# Patient Record
Sex: Female | Born: 1935 | Race: White | Hispanic: No | Smoking: Former smoker
Health system: Southern US, Community
[De-identification: ages and names within clinical notes are randomized; demographics above are authoritative.]

## PROBLEM LIST (undated history)

## (undated) DIAGNOSIS — I639 Cerebral infarction, unspecified: Secondary | ICD-10-CM

## (undated) DIAGNOSIS — I4891 Unspecified atrial fibrillation: Secondary | ICD-10-CM

## (undated) DIAGNOSIS — I059 Rheumatic mitral valve disease, unspecified: Secondary | ICD-10-CM

## (undated) DIAGNOSIS — K222 Esophageal obstruction: Secondary | ICD-10-CM

## (undated) DIAGNOSIS — J159 Unspecified bacterial pneumonia: Secondary | ICD-10-CM

## (undated) DIAGNOSIS — G459 Transient cerebral ischemic attack, unspecified: Secondary | ICD-10-CM

## (undated) DIAGNOSIS — K2900 Acute gastritis without bleeding: Secondary | ICD-10-CM

## (undated) DIAGNOSIS — I019 Acute rheumatic heart disease, unspecified: Secondary | ICD-10-CM

## (undated) DIAGNOSIS — K219 Gastro-esophageal reflux disease without esophagitis: Secondary | ICD-10-CM

## (undated) DIAGNOSIS — Z952 Presence of prosthetic heart valve: Secondary | ICD-10-CM

## (undated) DIAGNOSIS — K317 Polyp of stomach and duodenum: Secondary | ICD-10-CM

## (undated) DIAGNOSIS — G500 Trigeminal neuralgia: Secondary | ICD-10-CM

## (undated) DIAGNOSIS — K659 Peritonitis, unspecified: Secondary | ICD-10-CM

## (undated) DIAGNOSIS — K5790 Diverticulosis of intestine, part unspecified, without perforation or abscess without bleeding: Secondary | ICD-10-CM

## (undated) DIAGNOSIS — I5032 Chronic diastolic (congestive) heart failure: Secondary | ICD-10-CM

## (undated) HISTORY — PX: ESOPHAGEAL DILATION: SHX303

## (undated) HISTORY — DX: Rheumatic mitral valve disease, unspecified: I05.9

## (undated) HISTORY — DX: Acute rheumatic heart disease, unspecified: I01.9

## (undated) HISTORY — DX: Unspecified bacterial pneumonia: J15.9

## (undated) HISTORY — DX: Unspecified atrial fibrillation: I48.91

## (undated) HISTORY — DX: Peritonitis, unspecified: K65.9

## (undated) HISTORY — DX: Chronic diastolic (congestive) heart failure: I50.32

## (undated) HISTORY — DX: Gastro-esophageal reflux disease without esophagitis: K21.9

## (undated) HISTORY — DX: Presence of prosthetic heart valve: Z95.2

## (undated) HISTORY — DX: Trigeminal neuralgia: G50.0

## (undated) HISTORY — DX: Cerebral infarction, unspecified: I63.9

## (undated) HISTORY — DX: Acute gastritis without bleeding: K29.00

## (undated) HISTORY — DX: Transient cerebral ischemic attack, unspecified: G45.9

## (undated) HISTORY — DX: Polyp of stomach and duodenum: K31.7

## (undated) HISTORY — DX: Esophageal obstruction: K22.2

## (undated) HISTORY — DX: Diverticulosis of intestine, part unspecified, without perforation or abscess without bleeding: K57.90

---
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
## 1979-11-22 HISTORY — PX: MITRAL VALVE REPLACEMENT: SHX147

## 1990-08-21 ENCOUNTER — Encounter: Payer: Self-pay | Admitting: Internal Medicine

## 1998-09-02 ENCOUNTER — Emergency Department (HOSPITAL_COMMUNITY): Admission: EM | Admit: 1998-09-02 | Discharge: 1998-09-02 | Payer: Self-pay | Admitting: Emergency Medicine

## 1998-12-26 ENCOUNTER — Encounter: Payer: Self-pay | Admitting: Neurosurgery

## 1998-12-26 ENCOUNTER — Inpatient Hospital Stay (HOSPITAL_COMMUNITY): Admission: RE | Admit: 1998-12-26 | Discharge: 1999-01-09 | Payer: Self-pay | Admitting: Neurosurgery

## 1998-12-30 ENCOUNTER — Encounter: Payer: Self-pay | Admitting: Neurosurgery

## 1998-12-31 ENCOUNTER — Encounter: Payer: Self-pay | Admitting: Neurosurgery

## 1999-01-06 ENCOUNTER — Encounter: Payer: Self-pay | Admitting: Internal Medicine

## 1999-06-03 ENCOUNTER — Ambulatory Visit (HOSPITAL_COMMUNITY): Admission: RE | Admit: 1999-06-03 | Discharge: 1999-06-03 | Payer: Self-pay | Admitting: Neurosurgery

## 1999-06-03 ENCOUNTER — Encounter: Payer: Self-pay | Admitting: Neurosurgery

## 2000-06-21 ENCOUNTER — Encounter: Payer: Self-pay | Admitting: Neurosurgery

## 2000-06-21 ENCOUNTER — Ambulatory Visit (HOSPITAL_COMMUNITY): Admission: RE | Admit: 2000-06-21 | Discharge: 2000-06-21 | Payer: Self-pay | Admitting: Neurosurgery

## 2001-06-09 ENCOUNTER — Ambulatory Visit (HOSPITAL_COMMUNITY): Admission: RE | Admit: 2001-06-09 | Discharge: 2001-06-09 | Payer: Self-pay | Admitting: Neurosurgery

## 2001-06-09 ENCOUNTER — Encounter: Payer: Self-pay | Admitting: Neurosurgery

## 2001-07-02 ENCOUNTER — Encounter: Payer: Self-pay | Admitting: Internal Medicine

## 2001-08-10 ENCOUNTER — Encounter: Payer: Self-pay | Admitting: Internal Medicine

## 2001-08-15 ENCOUNTER — Encounter: Payer: Self-pay | Admitting: Internal Medicine

## 2001-08-15 ENCOUNTER — Encounter (INDEPENDENT_AMBULATORY_CARE_PROVIDER_SITE_OTHER): Payer: Self-pay | Admitting: *Deleted

## 2001-08-15 ENCOUNTER — Ambulatory Visit (HOSPITAL_COMMUNITY): Admission: RE | Admit: 2001-08-15 | Discharge: 2001-08-15 | Payer: Self-pay | Admitting: Internal Medicine

## 2001-08-31 ENCOUNTER — Ambulatory Visit (HOSPITAL_COMMUNITY): Admission: RE | Admit: 2001-08-31 | Discharge: 2001-08-31 | Payer: Self-pay | Admitting: Neurology

## 2001-08-31 ENCOUNTER — Encounter: Payer: Self-pay | Admitting: Neurology

## 2001-10-12 ENCOUNTER — Encounter: Admission: RE | Admit: 2001-10-12 | Discharge: 2001-11-22 | Payer: Self-pay | Admitting: Neurology

## 2001-11-21 HISTORY — PX: CHOLECYSTECTOMY: SHX55

## 2002-07-09 ENCOUNTER — Encounter: Payer: Self-pay | Admitting: Internal Medicine

## 2002-07-11 ENCOUNTER — Encounter (INDEPENDENT_AMBULATORY_CARE_PROVIDER_SITE_OTHER): Payer: Self-pay | Admitting: *Deleted

## 2002-07-11 ENCOUNTER — Ambulatory Visit (HOSPITAL_COMMUNITY): Admission: RE | Admit: 2002-07-11 | Discharge: 2002-07-11 | Payer: Self-pay | Admitting: Internal Medicine

## 2002-07-11 ENCOUNTER — Encounter: Payer: Self-pay | Admitting: Internal Medicine

## 2002-07-16 ENCOUNTER — Encounter: Payer: Self-pay | Admitting: Internal Medicine

## 2002-08-21 ENCOUNTER — Encounter: Payer: Self-pay | Admitting: Internal Medicine

## 2002-09-19 ENCOUNTER — Inpatient Hospital Stay (HOSPITAL_COMMUNITY): Admission: RE | Admit: 2002-09-19 | Discharge: 2002-09-29 | Payer: Self-pay | Admitting: General Surgery

## 2002-09-19 ENCOUNTER — Encounter: Payer: Self-pay | Admitting: General Surgery

## 2002-09-19 ENCOUNTER — Encounter (INDEPENDENT_AMBULATORY_CARE_PROVIDER_SITE_OTHER): Payer: Self-pay | Admitting: *Deleted

## 2002-09-23 ENCOUNTER — Encounter (INDEPENDENT_AMBULATORY_CARE_PROVIDER_SITE_OTHER): Payer: Self-pay | Admitting: *Deleted

## 2002-09-23 ENCOUNTER — Encounter: Payer: Self-pay | Admitting: General Surgery

## 2002-12-02 ENCOUNTER — Encounter (INDEPENDENT_AMBULATORY_CARE_PROVIDER_SITE_OTHER): Payer: Self-pay | Admitting: *Deleted

## 2003-11-19 ENCOUNTER — Emergency Department (HOSPITAL_COMMUNITY): Admission: EM | Admit: 2003-11-19 | Discharge: 2003-11-20 | Payer: Self-pay | Admitting: Emergency Medicine

## 2003-12-01 ENCOUNTER — Encounter: Payer: Self-pay | Admitting: Internal Medicine

## 2003-12-03 ENCOUNTER — Ambulatory Visit (HOSPITAL_COMMUNITY): Admission: RE | Admit: 2003-12-03 | Discharge: 2003-12-03 | Payer: Self-pay | Admitting: Internal Medicine

## 2003-12-31 ENCOUNTER — Encounter: Payer: Self-pay | Admitting: Internal Medicine

## 2004-09-09 ENCOUNTER — Inpatient Hospital Stay (HOSPITAL_COMMUNITY): Admission: EM | Admit: 2004-09-09 | Discharge: 2004-09-10 | Payer: Self-pay | Admitting: Emergency Medicine

## 2004-09-10 ENCOUNTER — Encounter: Payer: Self-pay | Admitting: Cardiology

## 2004-09-29 ENCOUNTER — Ambulatory Visit: Payer: Self-pay | Admitting: *Deleted

## 2004-10-18 ENCOUNTER — Ambulatory Visit (HOSPITAL_COMMUNITY): Admission: RE | Admit: 2004-10-18 | Discharge: 2004-10-18 | Payer: Self-pay | Admitting: Cardiology

## 2004-10-18 ENCOUNTER — Ambulatory Visit: Payer: Self-pay | Admitting: Cardiology

## 2004-10-18 ENCOUNTER — Ambulatory Visit: Payer: Self-pay | Admitting: Internal Medicine

## 2004-10-19 ENCOUNTER — Ambulatory Visit: Payer: Self-pay | Admitting: *Deleted

## 2004-11-09 ENCOUNTER — Ambulatory Visit: Payer: Self-pay | Admitting: Cardiology

## 2004-12-08 ENCOUNTER — Ambulatory Visit: Payer: Self-pay | Admitting: Cardiology

## 2004-12-09 ENCOUNTER — Ambulatory Visit: Payer: Self-pay | Admitting: *Deleted

## 2004-12-14 ENCOUNTER — Ambulatory Visit: Payer: Self-pay | Admitting: *Deleted

## 2005-01-06 ENCOUNTER — Ambulatory Visit: Payer: Self-pay | Admitting: Cardiovascular Disease

## 2005-01-13 ENCOUNTER — Ambulatory Visit: Payer: Self-pay | Admitting: Internal Medicine

## 2005-02-03 ENCOUNTER — Ambulatory Visit: Payer: Self-pay | Admitting: Cardiology

## 2005-02-04 ENCOUNTER — Ambulatory Visit: Payer: Self-pay | Admitting: Internal Medicine

## 2005-02-04 ENCOUNTER — Ambulatory Visit (HOSPITAL_COMMUNITY): Admission: RE | Admit: 2005-02-04 | Discharge: 2005-02-04 | Payer: Self-pay | Admitting: Internal Medicine

## 2005-02-04 ENCOUNTER — Encounter (INDEPENDENT_AMBULATORY_CARE_PROVIDER_SITE_OTHER): Payer: Self-pay | Admitting: *Deleted

## 2005-02-10 ENCOUNTER — Ambulatory Visit: Payer: Self-pay | Admitting: Cardiology

## 2005-02-23 ENCOUNTER — Ambulatory Visit: Payer: Self-pay | Admitting: *Deleted

## 2005-03-10 ENCOUNTER — Ambulatory Visit: Payer: Self-pay | Admitting: Internal Medicine

## 2005-03-24 ENCOUNTER — Ambulatory Visit: Payer: Self-pay | Admitting: Internal Medicine

## 2005-03-31 ENCOUNTER — Ambulatory Visit: Payer: Self-pay | Admitting: *Deleted

## 2005-04-06 ENCOUNTER — Ambulatory Visit: Payer: Self-pay

## 2005-04-14 ENCOUNTER — Ambulatory Visit: Payer: Self-pay | Admitting: *Deleted

## 2005-04-20 ENCOUNTER — Ambulatory Visit: Payer: Self-pay | Admitting: Cardiology

## 2005-05-20 ENCOUNTER — Ambulatory Visit: Payer: Self-pay | Admitting: Cardiology

## 2005-05-23 ENCOUNTER — Ambulatory Visit: Payer: Self-pay | Admitting: *Deleted

## 2005-06-01 ENCOUNTER — Ambulatory Visit: Payer: Self-pay | Admitting: Cardiology

## 2005-06-01 ENCOUNTER — Ambulatory Visit: Payer: Self-pay | Admitting: *Deleted

## 2005-06-13 ENCOUNTER — Ambulatory Visit: Payer: Self-pay | Admitting: Cardiology

## 2005-06-23 ENCOUNTER — Ambulatory Visit: Payer: Self-pay

## 2005-07-07 ENCOUNTER — Ambulatory Visit: Payer: Self-pay | Admitting: Cardiology

## 2005-07-12 ENCOUNTER — Ambulatory Visit: Payer: Self-pay | Admitting: *Deleted

## 2005-07-28 ENCOUNTER — Ambulatory Visit: Payer: Self-pay | Admitting: Cardiology

## 2005-08-11 ENCOUNTER — Ambulatory Visit: Payer: Self-pay | Admitting: Cardiology

## 2005-08-23 ENCOUNTER — Ambulatory Visit: Payer: Self-pay | Admitting: *Deleted

## 2005-08-25 ENCOUNTER — Ambulatory Visit: Payer: Self-pay | Admitting: Cardiology

## 2005-08-25 ENCOUNTER — Ambulatory Visit: Payer: Self-pay | Admitting: *Deleted

## 2005-08-30 ENCOUNTER — Ambulatory Visit: Payer: Self-pay | Admitting: *Deleted

## 2005-09-08 ENCOUNTER — Ambulatory Visit: Payer: Self-pay | Admitting: Cardiology

## 2005-09-22 ENCOUNTER — Ambulatory Visit: Payer: Self-pay | Admitting: Cardiology

## 2005-10-17 ENCOUNTER — Ambulatory Visit: Payer: Self-pay | Admitting: Cardiology

## 2005-10-24 ENCOUNTER — Ambulatory Visit: Payer: Self-pay | Admitting: *Deleted

## 2005-10-28 ENCOUNTER — Ambulatory Visit: Payer: Self-pay | Admitting: Cardiology

## 2005-11-18 ENCOUNTER — Ambulatory Visit: Payer: Self-pay | Admitting: Cardiology

## 2005-12-09 ENCOUNTER — Ambulatory Visit: Payer: Self-pay | Admitting: Cardiology

## 2005-12-28 ENCOUNTER — Ambulatory Visit: Payer: Self-pay | Admitting: Cardiology

## 2005-12-28 ENCOUNTER — Ambulatory Visit: Payer: Self-pay | Admitting: *Deleted

## 2006-01-06 ENCOUNTER — Ambulatory Visit: Payer: Self-pay | Admitting: Cardiovascular Disease

## 2006-01-13 ENCOUNTER — Ambulatory Visit: Payer: Self-pay | Admitting: Cardiology

## 2006-02-03 ENCOUNTER — Ambulatory Visit: Payer: Self-pay | Admitting: Cardiovascular Disease

## 2006-02-16 ENCOUNTER — Ambulatory Visit: Payer: Self-pay | Admitting: Cardiology

## 2006-03-03 ENCOUNTER — Ambulatory Visit: Payer: Self-pay | Admitting: Cardiology

## 2006-03-16 ENCOUNTER — Ambulatory Visit: Payer: Self-pay | Admitting: Cardiology

## 2006-03-30 ENCOUNTER — Ambulatory Visit: Payer: Self-pay | Admitting: Cardiology

## 2006-04-13 ENCOUNTER — Ambulatory Visit: Payer: Self-pay | Admitting: Internal Medicine

## 2006-05-04 ENCOUNTER — Ambulatory Visit: Payer: Self-pay | Admitting: Cardiology

## 2006-05-04 ENCOUNTER — Ambulatory Visit: Payer: Self-pay | Admitting: *Deleted

## 2006-05-25 ENCOUNTER — Ambulatory Visit: Payer: Self-pay | Admitting: Cardiology

## 2006-06-14 ENCOUNTER — Ambulatory Visit: Payer: Self-pay | Admitting: Cardiology

## 2006-06-28 ENCOUNTER — Ambulatory Visit: Payer: Self-pay | Admitting: Cardiology

## 2006-07-19 ENCOUNTER — Ambulatory Visit: Payer: Self-pay | Admitting: Cardiology

## 2006-08-16 ENCOUNTER — Ambulatory Visit: Payer: Self-pay | Admitting: Cardiology

## 2006-08-23 ENCOUNTER — Ambulatory Visit: Payer: Self-pay | Admitting: *Deleted

## 2006-09-06 ENCOUNTER — Ambulatory Visit: Payer: Self-pay | Admitting: *Deleted

## 2006-09-13 ENCOUNTER — Ambulatory Visit: Payer: Self-pay | Admitting: *Deleted

## 2006-09-27 ENCOUNTER — Ambulatory Visit: Payer: Self-pay | Admitting: Cardiology

## 2006-10-18 ENCOUNTER — Ambulatory Visit: Payer: Self-pay | Admitting: Cardiovascular Disease

## 2006-10-27 ENCOUNTER — Ambulatory Visit: Payer: Self-pay | Admitting: Cardiology

## 2006-11-16 ENCOUNTER — Ambulatory Visit: Payer: Self-pay | Admitting: Internal Medicine

## 2006-12-08 ENCOUNTER — Ambulatory Visit: Payer: Self-pay | Admitting: *Deleted

## 2006-12-08 ENCOUNTER — Ambulatory Visit: Payer: Self-pay | Admitting: Internal Medicine

## 2006-12-08 LAB — CONVERTED CEMR LAB
CO2: 31 meq/L (ref 19–32)
Calcium: 9.5 mg/dL (ref 8.4–10.5)
Chloride: 103 meq/L (ref 96–112)
Creatinine, Ser: 0.9 mg/dL (ref 0.4–1.2)
GFR calc Af Amer: 77 mL/min
Glucose, Bld: 88 mg/dL (ref 70–99)

## 2006-12-22 ENCOUNTER — Ambulatory Visit: Payer: Self-pay | Admitting: Internal Medicine

## 2007-01-12 ENCOUNTER — Ambulatory Visit: Payer: Self-pay | Admitting: Internal Medicine

## 2007-02-02 ENCOUNTER — Ambulatory Visit: Payer: Self-pay | Admitting: Cardiovascular Disease

## 2007-02-13 ENCOUNTER — Ambulatory Visit: Payer: Self-pay | Admitting: Cardiology

## 2007-02-20 ENCOUNTER — Encounter: Payer: Self-pay | Admitting: Internal Medicine

## 2007-02-21 ENCOUNTER — Ambulatory Visit: Payer: Self-pay | Admitting: Cardiology

## 2007-02-26 ENCOUNTER — Ambulatory Visit: Payer: Self-pay | Admitting: Cardiology

## 2007-03-06 ENCOUNTER — Ambulatory Visit: Payer: Self-pay | Admitting: *Deleted

## 2007-03-19 ENCOUNTER — Ambulatory Visit: Payer: Self-pay | Admitting: *Deleted

## 2007-03-19 ENCOUNTER — Ambulatory Visit: Payer: Self-pay

## 2007-03-19 ENCOUNTER — Ambulatory Visit: Payer: Self-pay | Admitting: Cardiology

## 2007-03-19 ENCOUNTER — Encounter: Payer: Self-pay | Admitting: Cardiology

## 2007-03-19 LAB — CONVERTED CEMR LAB
CO2: 33 meq/L — ABNORMAL HIGH (ref 19–32)
Chloride: 104 meq/L (ref 96–112)
Creatinine, Ser: 0.9 mg/dL (ref 0.4–1.2)
Glucose, Bld: 140 mg/dL — ABNORMAL HIGH (ref 70–99)
Sodium: 141 meq/L (ref 135–145)

## 2007-04-03 ENCOUNTER — Ambulatory Visit: Payer: Self-pay | Admitting: Cardiology

## 2007-04-05 ENCOUNTER — Encounter: Admission: RE | Admit: 2007-04-05 | Discharge: 2007-04-05 | Payer: Self-pay | Admitting: Family Medicine

## 2007-04-10 ENCOUNTER — Ambulatory Visit: Payer: Self-pay | Admitting: Cardiology

## 2007-04-13 ENCOUNTER — Ambulatory Visit: Payer: Self-pay | Admitting: Cardiovascular Disease

## 2007-04-20 ENCOUNTER — Ambulatory Visit: Payer: Self-pay | Admitting: Cardiology

## 2007-04-20 LAB — CONVERTED CEMR LAB: Prothrombin Time: 29.1 s — ABNORMAL HIGH (ref 10.0–14.0)

## 2007-04-24 ENCOUNTER — Encounter: Admission: RE | Admit: 2007-04-24 | Discharge: 2007-04-24 | Payer: Self-pay | Admitting: Family Medicine

## 2007-04-27 ENCOUNTER — Ambulatory Visit: Payer: Self-pay | Admitting: Cardiology

## 2007-05-07 ENCOUNTER — Ambulatory Visit: Payer: Self-pay | Admitting: Cardiology

## 2007-05-21 ENCOUNTER — Ambulatory Visit: Payer: Self-pay | Admitting: Cardiology

## 2007-05-30 IMAGING — CR DG CHEST 2V
2 series · 2 of 2 positions shown · non-contrast
Comparison: 09/09/04

CLINICAL DATA: Wheezing/cough.

 CHEST - 2 VIEW:

[w chest pa]
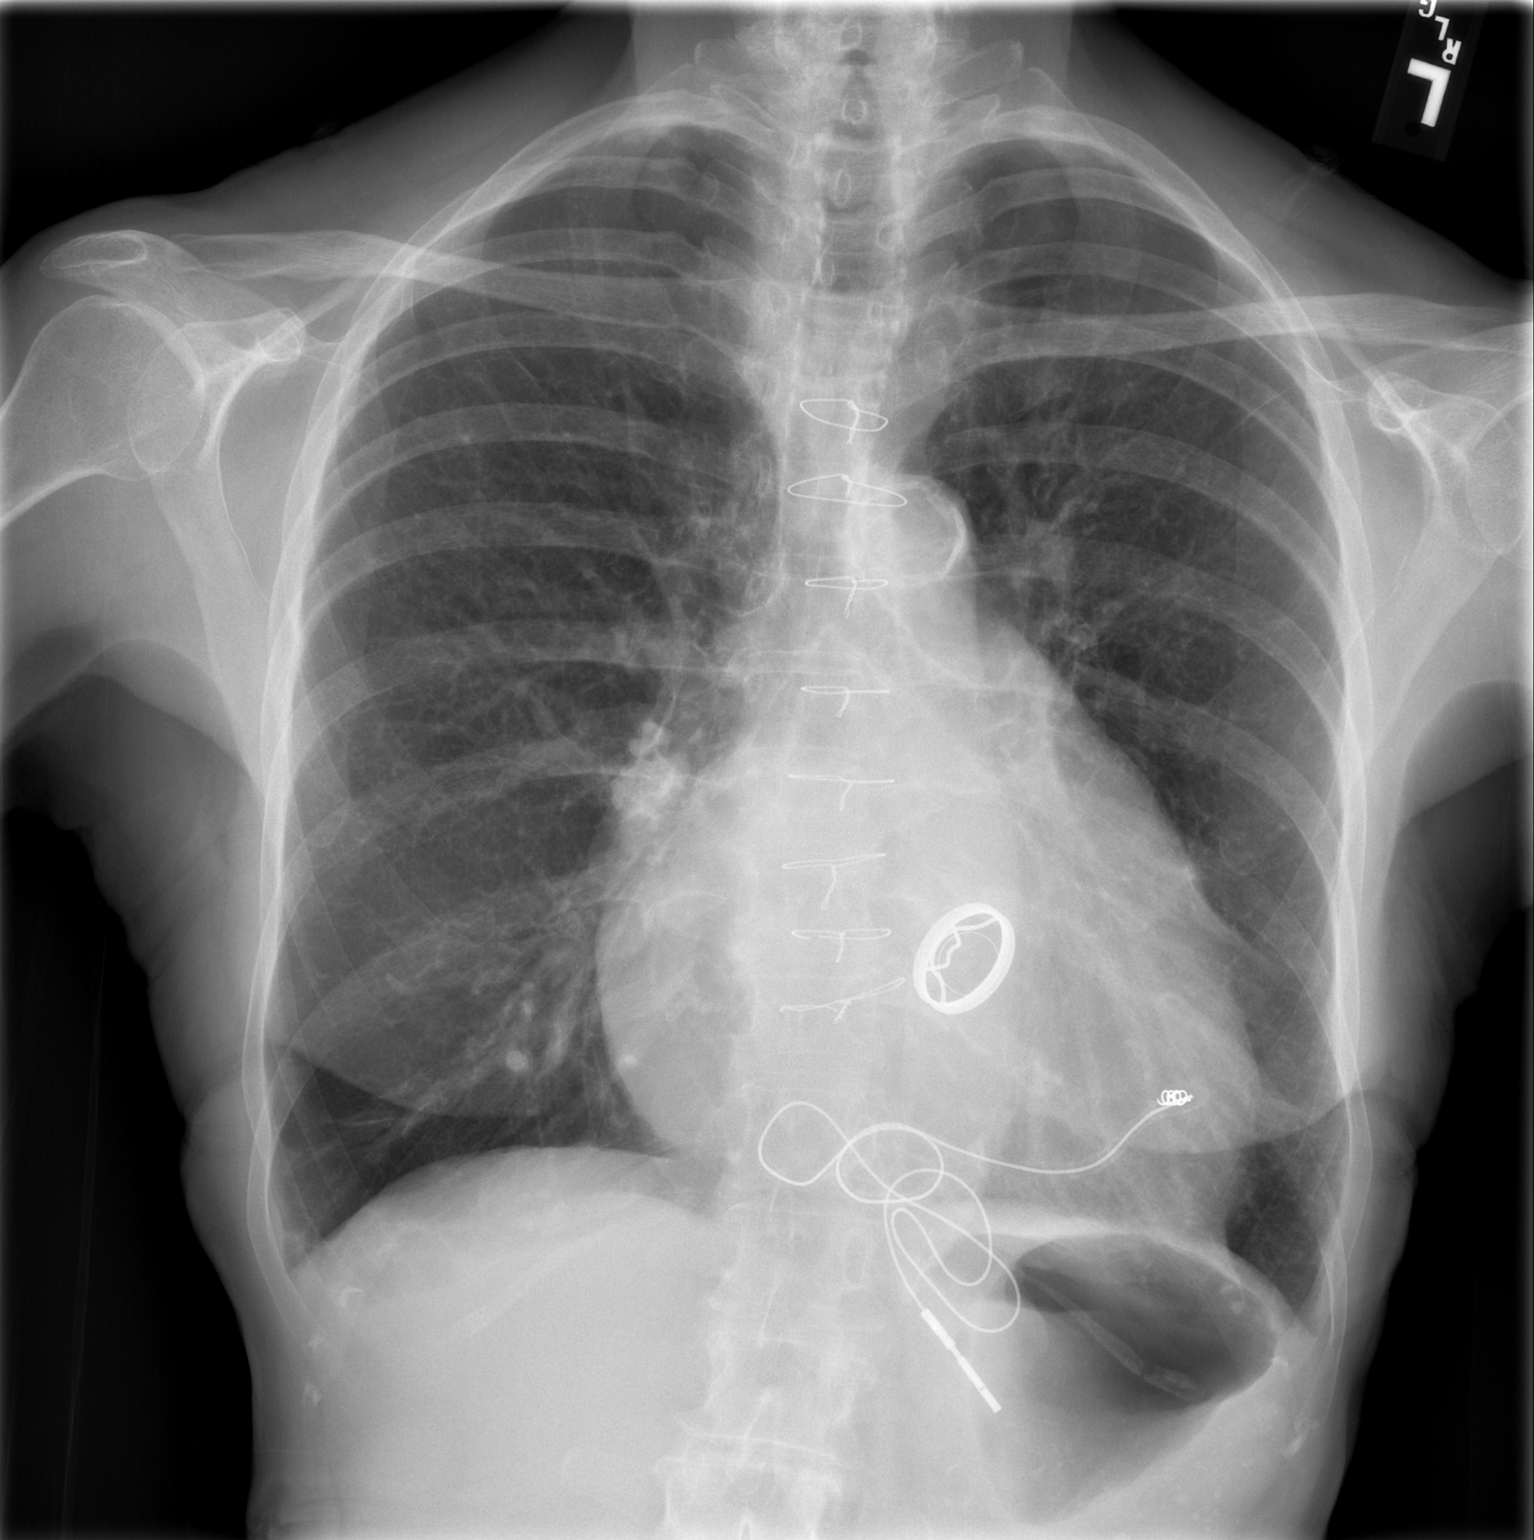

[w chest lat]
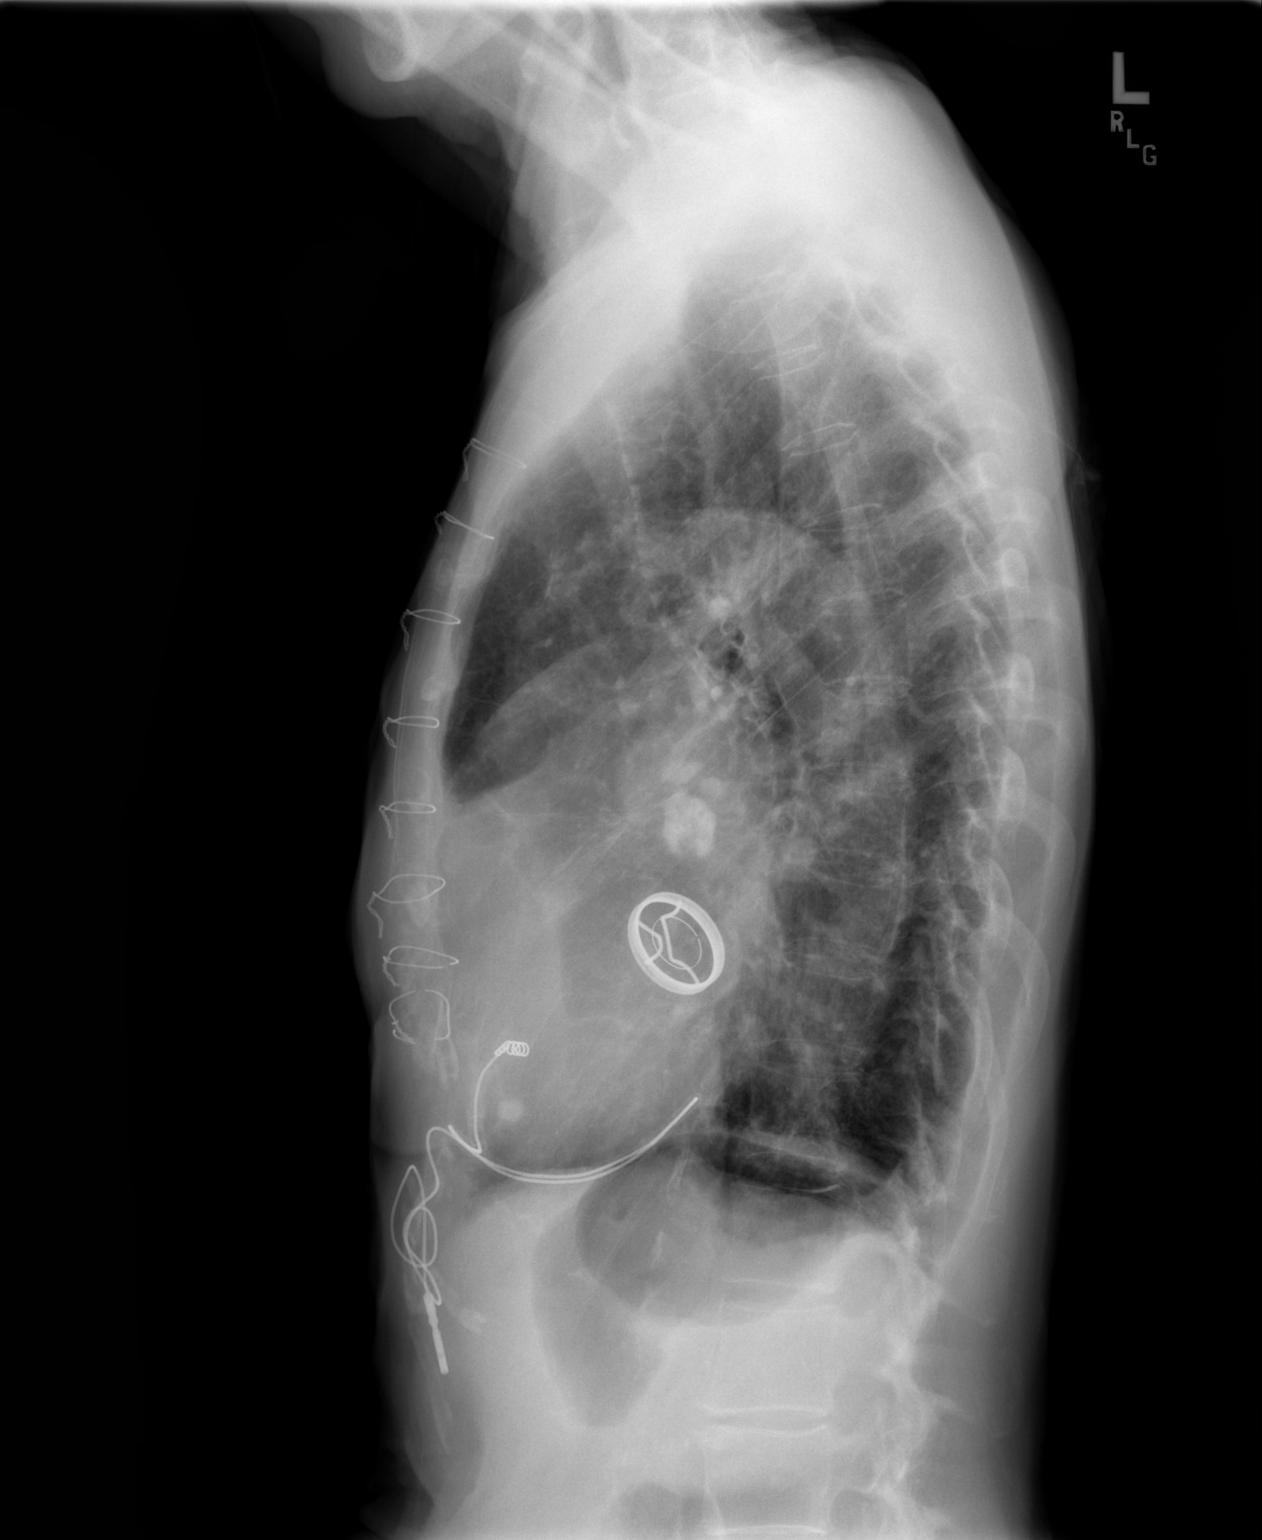

[2 of 2 positions shown; findings below may reference images not displayed]

FINDINGS: The heart is enlarged.  The patient has a mitral valve and epicardial pacer leads.  There is no congestive heart failure or active disease.  There is moderate COPD.  Old granulomatous changes of the right lung.
IMPRESSION: 1.  Cardiomegaly with post operative changes - no congestive heart failure. 
 2.  COPD - no active disease.

## 2007-05-31 ENCOUNTER — Ambulatory Visit: Payer: Self-pay | Admitting: Cardiovascular Disease

## 2007-06-13 ENCOUNTER — Ambulatory Visit: Payer: Self-pay | Admitting: Cardiology

## 2007-06-27 ENCOUNTER — Ambulatory Visit: Payer: Self-pay | Admitting: Cardiology

## 2007-07-09 ENCOUNTER — Ambulatory Visit: Payer: Self-pay | Admitting: Cardiology

## 2007-07-25 ENCOUNTER — Ambulatory Visit: Payer: Self-pay | Admitting: Internal Medicine

## 2007-08-20 ENCOUNTER — Ambulatory Visit: Payer: Self-pay | Admitting: Cardiovascular Disease

## 2007-08-31 ENCOUNTER — Ambulatory Visit: Payer: Self-pay | Admitting: Cardiology

## 2007-09-14 ENCOUNTER — Ambulatory Visit: Payer: Self-pay | Admitting: Cardiovascular Disease

## 2007-09-14 LAB — CONVERTED CEMR LAB
Basophils Absolute: 0 10*3/uL (ref 0.0–0.1)
Eosinophils Relative: 1.3 % (ref 0.0–5.0)
HCT: 43 % (ref 36.0–46.0)
INR: 4.6 — ABNORMAL HIGH (ref 0.8–1.0)
Neutrophils Relative %: 70.2 % (ref 43.0–77.0)
Platelets: 213 10*3/uL (ref 150–400)
RBC: 4.66 M/uL (ref 3.87–5.11)
RDW: 13.4 % (ref 11.5–14.6)
WBC: 6.9 10*3/uL (ref 4.5–10.5)

## 2007-10-05 ENCOUNTER — Ambulatory Visit: Payer: Self-pay | Admitting: Cardiology

## 2007-10-23 ENCOUNTER — Ambulatory Visit: Payer: Self-pay | Admitting: Cardiology

## 2007-11-08 ENCOUNTER — Ambulatory Visit: Payer: Self-pay | Admitting: Cardiology

## 2007-11-19 ENCOUNTER — Ambulatory Visit: Payer: Self-pay | Admitting: Cardiology

## 2007-11-27 ENCOUNTER — Ambulatory Visit: Payer: Self-pay | Admitting: Cardiology

## 2007-12-11 ENCOUNTER — Ambulatory Visit: Payer: Self-pay | Admitting: Cardiovascular Disease

## 2007-12-25 ENCOUNTER — Ambulatory Visit: Payer: Self-pay | Admitting: Cardiovascular Disease

## 2008-01-08 ENCOUNTER — Ambulatory Visit: Payer: Self-pay | Admitting: Cardiology

## 2008-01-23 ENCOUNTER — Ambulatory Visit: Payer: Self-pay | Admitting: Cardiology

## 2008-01-23 ENCOUNTER — Ambulatory Visit: Payer: Self-pay | Admitting: Cardiovascular Disease

## 2008-02-06 ENCOUNTER — Ambulatory Visit: Payer: Self-pay | Admitting: Cardiology

## 2008-02-20 ENCOUNTER — Ambulatory Visit: Payer: Self-pay | Admitting: Internal Medicine

## 2008-03-04 ENCOUNTER — Ambulatory Visit: Payer: Self-pay | Admitting: Cardiology

## 2008-03-18 ENCOUNTER — Ambulatory Visit: Payer: Self-pay | Admitting: Cardiology

## 2008-04-07 ENCOUNTER — Ambulatory Visit: Payer: Self-pay | Admitting: Cardiology

## 2008-04-21 ENCOUNTER — Ambulatory Visit: Payer: Self-pay | Admitting: Cardiovascular Disease

## 2008-05-07 ENCOUNTER — Ambulatory Visit: Payer: Self-pay | Admitting: Cardiology

## 2008-05-21 ENCOUNTER — Ambulatory Visit: Payer: Self-pay | Admitting: Internal Medicine

## 2008-06-08 ENCOUNTER — Inpatient Hospital Stay (HOSPITAL_COMMUNITY): Admission: EM | Admit: 2008-06-08 | Discharge: 2008-06-10 | Payer: Self-pay | Admitting: Emergency Medicine

## 2008-06-10 ENCOUNTER — Encounter (INDEPENDENT_AMBULATORY_CARE_PROVIDER_SITE_OTHER): Payer: Self-pay | Admitting: Internal Medicine

## 2008-06-13 ENCOUNTER — Ambulatory Visit: Payer: Self-pay | Admitting: Cardiology

## 2008-06-18 ENCOUNTER — Ambulatory Visit: Payer: Self-pay | Admitting: Internal Medicine

## 2008-06-26 ENCOUNTER — Ambulatory Visit: Payer: Self-pay | Admitting: Internal Medicine

## 2008-06-27 ENCOUNTER — Encounter: Admission: RE | Admit: 2008-06-27 | Discharge: 2008-06-27 | Payer: Self-pay | Admitting: Internal Medicine

## 2008-07-09 ENCOUNTER — Ambulatory Visit: Payer: Self-pay | Admitting: Cardiovascular Disease

## 2008-07-30 ENCOUNTER — Ambulatory Visit: Payer: Self-pay | Admitting: Cardiovascular Disease

## 2008-08-01 ENCOUNTER — Ambulatory Visit: Payer: Self-pay | Admitting: Internal Medicine

## 2008-08-01 LAB — CONVERTED CEMR LAB
INR: 5.2 — ABNORMAL HIGH (ref 0.8–1.0)
Prothrombin Time: 50.8 s — ABNORMAL HIGH (ref 10.9–13.3)

## 2008-08-11 ENCOUNTER — Ambulatory Visit: Payer: Self-pay | Admitting: Cardiovascular Disease

## 2008-08-15 ENCOUNTER — Ambulatory Visit: Payer: Self-pay | Admitting: Internal Medicine

## 2008-08-29 ENCOUNTER — Ambulatory Visit: Payer: Self-pay | Admitting: Cardiology

## 2008-09-05 ENCOUNTER — Ambulatory Visit: Payer: Self-pay

## 2008-09-12 ENCOUNTER — Ambulatory Visit: Payer: Self-pay | Admitting: Cardiology

## 2008-09-26 ENCOUNTER — Ambulatory Visit: Payer: Self-pay | Admitting: Cardiology

## 2008-10-07 DIAGNOSIS — Z9889 Other specified postprocedural states: Secondary | ICD-10-CM | POA: Insufficient documentation

## 2008-10-07 DIAGNOSIS — I4891 Unspecified atrial fibrillation: Secondary | ICD-10-CM | POA: Insufficient documentation

## 2008-10-07 DIAGNOSIS — G459 Transient cerebral ischemic attack, unspecified: Secondary | ICD-10-CM | POA: Insufficient documentation

## 2008-10-09 ENCOUNTER — Ambulatory Visit: Payer: Self-pay | Admitting: Cardiology

## 2008-10-31 ENCOUNTER — Ambulatory Visit: Payer: Self-pay | Admitting: Cardiovascular Disease

## 2008-11-18 ENCOUNTER — Ambulatory Visit: Payer: Self-pay | Admitting: Cardiovascular Disease

## 2008-11-26 ENCOUNTER — Ambulatory Visit: Payer: Self-pay | Admitting: Cardiology

## 2008-12-09 ENCOUNTER — Ambulatory Visit: Payer: Self-pay | Admitting: Cardiology

## 2008-12-16 ENCOUNTER — Ambulatory Visit (HOSPITAL_COMMUNITY): Admission: RE | Admit: 2008-12-16 | Discharge: 2008-12-16 | Payer: Self-pay | Admitting: Neurosurgery

## 2008-12-24 ENCOUNTER — Ambulatory Visit: Payer: Self-pay | Admitting: Cardiology

## 2009-01-07 ENCOUNTER — Ambulatory Visit: Payer: Self-pay | Admitting: Cardiology

## 2009-01-13 ENCOUNTER — Ambulatory Visit: Payer: Self-pay | Admitting: Cardiovascular Disease

## 2009-01-20 ENCOUNTER — Encounter: Payer: Self-pay | Admitting: Cardiovascular Disease

## 2009-01-20 ENCOUNTER — Ambulatory Visit: Payer: Self-pay | Admitting: Cardiovascular Disease

## 2009-01-20 ENCOUNTER — Ambulatory Visit: Payer: Self-pay | Admitting: Internal Medicine

## 2009-01-29 ENCOUNTER — Ambulatory Visit: Payer: Self-pay

## 2009-01-29 ENCOUNTER — Encounter: Payer: Self-pay | Admitting: Cardiovascular Disease

## 2009-02-02 ENCOUNTER — Ambulatory Visit: Payer: Self-pay | Admitting: Cardiology

## 2009-02-12 ENCOUNTER — Ambulatory Visit: Payer: Self-pay | Admitting: Cardiology

## 2009-02-25 ENCOUNTER — Ambulatory Visit: Payer: Self-pay | Admitting: Cardiology

## 2009-03-16 ENCOUNTER — Encounter: Payer: Self-pay | Admitting: Cardiovascular Disease

## 2009-03-18 ENCOUNTER — Ambulatory Visit: Payer: Self-pay | Admitting: Cardiology

## 2009-03-18 LAB — CONVERTED CEMR LAB
INR: 4.4 — ABNORMAL HIGH (ref 0.8–1.0)
Prothrombin Time: 43.6 s — ABNORMAL HIGH (ref 10.9–13.3)

## 2009-03-24 ENCOUNTER — Telehealth (INDEPENDENT_AMBULATORY_CARE_PROVIDER_SITE_OTHER): Payer: Self-pay | Admitting: *Deleted

## 2009-03-30 ENCOUNTER — Ambulatory Visit: Payer: Self-pay | Admitting: Cardiovascular Disease

## 2009-04-10 ENCOUNTER — Ambulatory Visit: Payer: Self-pay | Admitting: Cardiology

## 2009-04-17 ENCOUNTER — Ambulatory Visit: Payer: Self-pay | Admitting: Internal Medicine

## 2009-04-17 LAB — CONVERTED CEMR LAB: POC INR: 4.5

## 2009-04-21 ENCOUNTER — Encounter: Payer: Self-pay | Admitting: *Deleted

## 2009-05-01 ENCOUNTER — Ambulatory Visit: Payer: Self-pay | Admitting: Cardiovascular Disease

## 2009-05-01 LAB — CONVERTED CEMR LAB: Protime: 20.2

## 2009-05-05 ENCOUNTER — Encounter (INDEPENDENT_AMBULATORY_CARE_PROVIDER_SITE_OTHER): Payer: Self-pay | Admitting: *Deleted

## 2009-05-07 ENCOUNTER — Telehealth: Payer: Self-pay | Admitting: Internal Medicine

## 2009-05-07 ENCOUNTER — Ambulatory Visit: Payer: Self-pay | Admitting: Internal Medicine

## 2009-05-22 ENCOUNTER — Encounter (INDEPENDENT_AMBULATORY_CARE_PROVIDER_SITE_OTHER): Payer: Self-pay | Admitting: Cardiology

## 2009-05-22 ENCOUNTER — Ambulatory Visit: Payer: Self-pay | Admitting: Cardiovascular Disease

## 2009-05-22 LAB — CONVERTED CEMR LAB: Prothrombin Time: 19.8 s

## 2009-05-27 ENCOUNTER — Encounter: Payer: Self-pay | Admitting: *Deleted

## 2009-06-03 ENCOUNTER — Ambulatory Visit: Payer: Self-pay | Admitting: Internal Medicine

## 2009-06-03 LAB — CONVERTED CEMR LAB
POC INR: 3.6
Prothrombin Time: 23.1 s

## 2009-06-17 ENCOUNTER — Ambulatory Visit: Payer: Self-pay | Admitting: Cardiology

## 2009-06-17 LAB — CONVERTED CEMR LAB: Prothrombin Time: 22.9 s

## 2009-07-08 ENCOUNTER — Ambulatory Visit: Payer: Self-pay | Admitting: Cardiology

## 2009-07-29 ENCOUNTER — Ambulatory Visit: Payer: Self-pay | Admitting: Internal Medicine

## 2009-07-29 LAB — CONVERTED CEMR LAB: POC INR: 3.8

## 2009-08-07 ENCOUNTER — Ambulatory Visit: Payer: Self-pay | Admitting: Internal Medicine

## 2009-08-07 LAB — CONVERTED CEMR LAB
Bilirubin Urine: NEGATIVE
Ketones, ur: NEGATIVE mg/dL
Leukocytes, UA: NEGATIVE
Specific Gravity, Urine: 1.02 (ref 1.000–1.030)
Urine Glucose: NEGATIVE mg/dL
pH: 6 (ref 5.0–8.0)

## 2009-08-09 DIAGNOSIS — K659 Peritonitis, unspecified: Secondary | ICD-10-CM | POA: Insufficient documentation

## 2009-08-09 DIAGNOSIS — I019 Acute rheumatic heart disease, unspecified: Secondary | ICD-10-CM | POA: Insufficient documentation

## 2009-08-09 DIAGNOSIS — M109 Gout, unspecified: Secondary | ICD-10-CM

## 2009-08-09 DIAGNOSIS — K222 Esophageal obstruction: Secondary | ICD-10-CM

## 2009-08-09 DIAGNOSIS — G500 Trigeminal neuralgia: Secondary | ICD-10-CM | POA: Insufficient documentation

## 2009-08-09 DIAGNOSIS — K219 Gastro-esophageal reflux disease without esophagitis: Secondary | ICD-10-CM

## 2009-08-09 DIAGNOSIS — I059 Rheumatic mitral valve disease, unspecified: Secondary | ICD-10-CM | POA: Insufficient documentation

## 2009-08-09 DIAGNOSIS — B029 Zoster without complications: Secondary | ICD-10-CM | POA: Insufficient documentation

## 2009-08-09 DIAGNOSIS — I6350 Cerebral infarction due to unspecified occlusion or stenosis of unspecified cerebral artery: Secondary | ICD-10-CM | POA: Insufficient documentation

## 2009-08-09 DIAGNOSIS — K2900 Acute gastritis without bleeding: Secondary | ICD-10-CM | POA: Insufficient documentation

## 2009-08-09 DIAGNOSIS — J159 Unspecified bacterial pneumonia: Secondary | ICD-10-CM | POA: Insufficient documentation

## 2009-08-11 ENCOUNTER — Encounter: Payer: Self-pay | Admitting: Internal Medicine

## 2009-08-12 ENCOUNTER — Ambulatory Visit: Payer: Self-pay | Admitting: Internal Medicine

## 2009-08-12 LAB — CONVERTED CEMR LAB: POC INR: 3.1

## 2009-08-17 ENCOUNTER — Ambulatory Visit: Payer: Self-pay | Admitting: Cardiovascular Disease

## 2009-08-21 ENCOUNTER — Ambulatory Visit: Payer: Self-pay

## 2009-08-21 ENCOUNTER — Encounter: Payer: Self-pay | Admitting: Cardiology

## 2009-08-21 ENCOUNTER — Ambulatory Visit: Payer: Self-pay | Admitting: Internal Medicine

## 2009-08-21 LAB — CONVERTED CEMR LAB: POC INR: 3.4

## 2009-09-04 ENCOUNTER — Ambulatory Visit: Payer: Self-pay | Admitting: Internal Medicine

## 2009-09-04 LAB — CONVERTED CEMR LAB: POC INR: 3.4

## 2009-09-17 ENCOUNTER — Ambulatory Visit: Payer: Self-pay | Admitting: Internal Medicine

## 2009-09-18 DIAGNOSIS — R413 Other amnesia: Secondary | ICD-10-CM

## 2009-09-25 ENCOUNTER — Ambulatory Visit: Payer: Self-pay | Admitting: Cardiovascular Disease

## 2009-09-25 LAB — CONVERTED CEMR LAB: POC INR: 3.6

## 2009-10-07 ENCOUNTER — Ambulatory Visit: Payer: Self-pay | Admitting: Cardiovascular Disease

## 2009-10-14 ENCOUNTER — Encounter: Payer: Self-pay | Admitting: Internal Medicine

## 2009-10-22 ENCOUNTER — Ambulatory Visit: Payer: Self-pay | Admitting: Cardiology

## 2009-10-22 ENCOUNTER — Telehealth: Payer: Self-pay | Admitting: Internal Medicine

## 2009-10-22 ENCOUNTER — Ambulatory Visit (HOSPITAL_COMMUNITY): Admission: RE | Admit: 2009-10-22 | Discharge: 2009-10-22 | Payer: Self-pay | Admitting: Cardiovascular Disease

## 2009-10-22 ENCOUNTER — Ambulatory Visit: Payer: Self-pay | Admitting: Cardiovascular Disease

## 2009-10-22 ENCOUNTER — Encounter: Payer: Self-pay | Admitting: Cardiovascular Disease

## 2009-10-22 ENCOUNTER — Ambulatory Visit: Payer: Self-pay

## 2009-10-22 LAB — CONVERTED CEMR LAB: POC INR: 3.2

## 2009-11-03 ENCOUNTER — Encounter (INDEPENDENT_AMBULATORY_CARE_PROVIDER_SITE_OTHER): Payer: Self-pay | Admitting: Cardiology

## 2009-11-03 ENCOUNTER — Ambulatory Visit: Payer: Self-pay | Admitting: Cardiology

## 2009-11-03 LAB — CONVERTED CEMR LAB: POC INR: 3.9

## 2009-11-24 ENCOUNTER — Ambulatory Visit: Payer: Self-pay | Admitting: Cardiology

## 2009-12-10 ENCOUNTER — Telehealth (INDEPENDENT_AMBULATORY_CARE_PROVIDER_SITE_OTHER): Payer: Self-pay | Admitting: *Deleted

## 2009-12-11 ENCOUNTER — Ambulatory Visit: Payer: Self-pay | Admitting: Cardiology

## 2009-12-14 ENCOUNTER — Telehealth: Payer: Self-pay | Admitting: Cardiovascular Disease

## 2010-01-01 ENCOUNTER — Ambulatory Visit: Payer: Self-pay | Admitting: Internal Medicine

## 2010-01-01 LAB — CONVERTED CEMR LAB: POC INR: 3.4

## 2010-01-22 ENCOUNTER — Ambulatory Visit: Payer: Self-pay | Admitting: Cardiology

## 2010-02-02 ENCOUNTER — Telehealth: Payer: Self-pay | Admitting: Cardiovascular Disease

## 2010-02-05 ENCOUNTER — Ambulatory Visit: Payer: Self-pay | Admitting: Cardiology

## 2010-02-05 ENCOUNTER — Ambulatory Visit: Payer: Self-pay | Admitting: Cardiovascular Disease

## 2010-02-12 ENCOUNTER — Ambulatory Visit (HOSPITAL_COMMUNITY): Admission: RE | Admit: 2010-02-12 | Discharge: 2010-02-12 | Payer: Self-pay | Admitting: Cardiovascular Disease

## 2010-02-12 ENCOUNTER — Encounter (INDEPENDENT_AMBULATORY_CARE_PROVIDER_SITE_OTHER): Payer: Self-pay | Admitting: *Deleted

## 2010-02-12 ENCOUNTER — Ambulatory Visit: Payer: Self-pay

## 2010-02-12 ENCOUNTER — Encounter: Payer: Self-pay | Admitting: Cardiovascular Disease

## 2010-02-12 ENCOUNTER — Ambulatory Visit: Payer: Self-pay | Admitting: Cardiology

## 2010-02-17 ENCOUNTER — Telehealth: Payer: Self-pay | Admitting: Cardiovascular Disease

## 2010-02-25 ENCOUNTER — Ambulatory Visit: Payer: Self-pay | Admitting: Cardiovascular Disease

## 2010-02-25 LAB — CONVERTED CEMR LAB: POC INR: 4.1

## 2010-02-26 LAB — CONVERTED CEMR LAB
CO2: 35 meq/L — ABNORMAL HIGH (ref 19–32)
Calcium: 9.3 mg/dL (ref 8.4–10.5)
Chloride: 100 meq/L (ref 96–112)
Glucose, Bld: 85 mg/dL (ref 70–99)
INR: 3 — ABNORMAL HIGH (ref 0.8–1.0)
Potassium: 3.8 meq/L (ref 3.5–5.1)
Prothrombin Time: 31.1 s — ABNORMAL HIGH (ref 9.1–11.7)
Sodium: 142 meq/L (ref 135–145)

## 2010-03-05 ENCOUNTER — Telehealth: Payer: Self-pay | Admitting: Cardiovascular Disease

## 2010-03-09 ENCOUNTER — Telehealth: Payer: Self-pay | Admitting: Cardiovascular Disease

## 2010-03-15 ENCOUNTER — Ambulatory Visit: Payer: Self-pay | Admitting: Cardiovascular Disease

## 2010-03-16 LAB — CONVERTED CEMR LAB
CO2: 31 meq/L (ref 19–32)
Chloride: 99 meq/L (ref 96–112)
Creatinine, Ser: 0.9 mg/dL (ref 0.4–1.2)
Pro B Natriuretic peptide (BNP): 338 pg/mL — ABNORMAL HIGH (ref 0.0–100.0)
Sodium: 141 meq/L (ref 135–145)

## 2010-03-18 ENCOUNTER — Ambulatory Visit: Payer: Self-pay | Admitting: Cardiology

## 2010-03-18 LAB — CONVERTED CEMR LAB: POC INR: 3.6

## 2010-03-30 ENCOUNTER — Ambulatory Visit: Payer: Self-pay | Admitting: Cardiovascular Disease

## 2010-04-08 ENCOUNTER — Ambulatory Visit: Payer: Self-pay | Admitting: Internal Medicine

## 2010-04-21 IMAGING — CR DG CHEST 2V
2 series · 2 of 2 positions shown · non-contrast
Comparison: 06/08/2008

CLINICAL DATA: Shortness of breath.

CHEST - 2 VIEW

[view not recorded (1 of 2)]
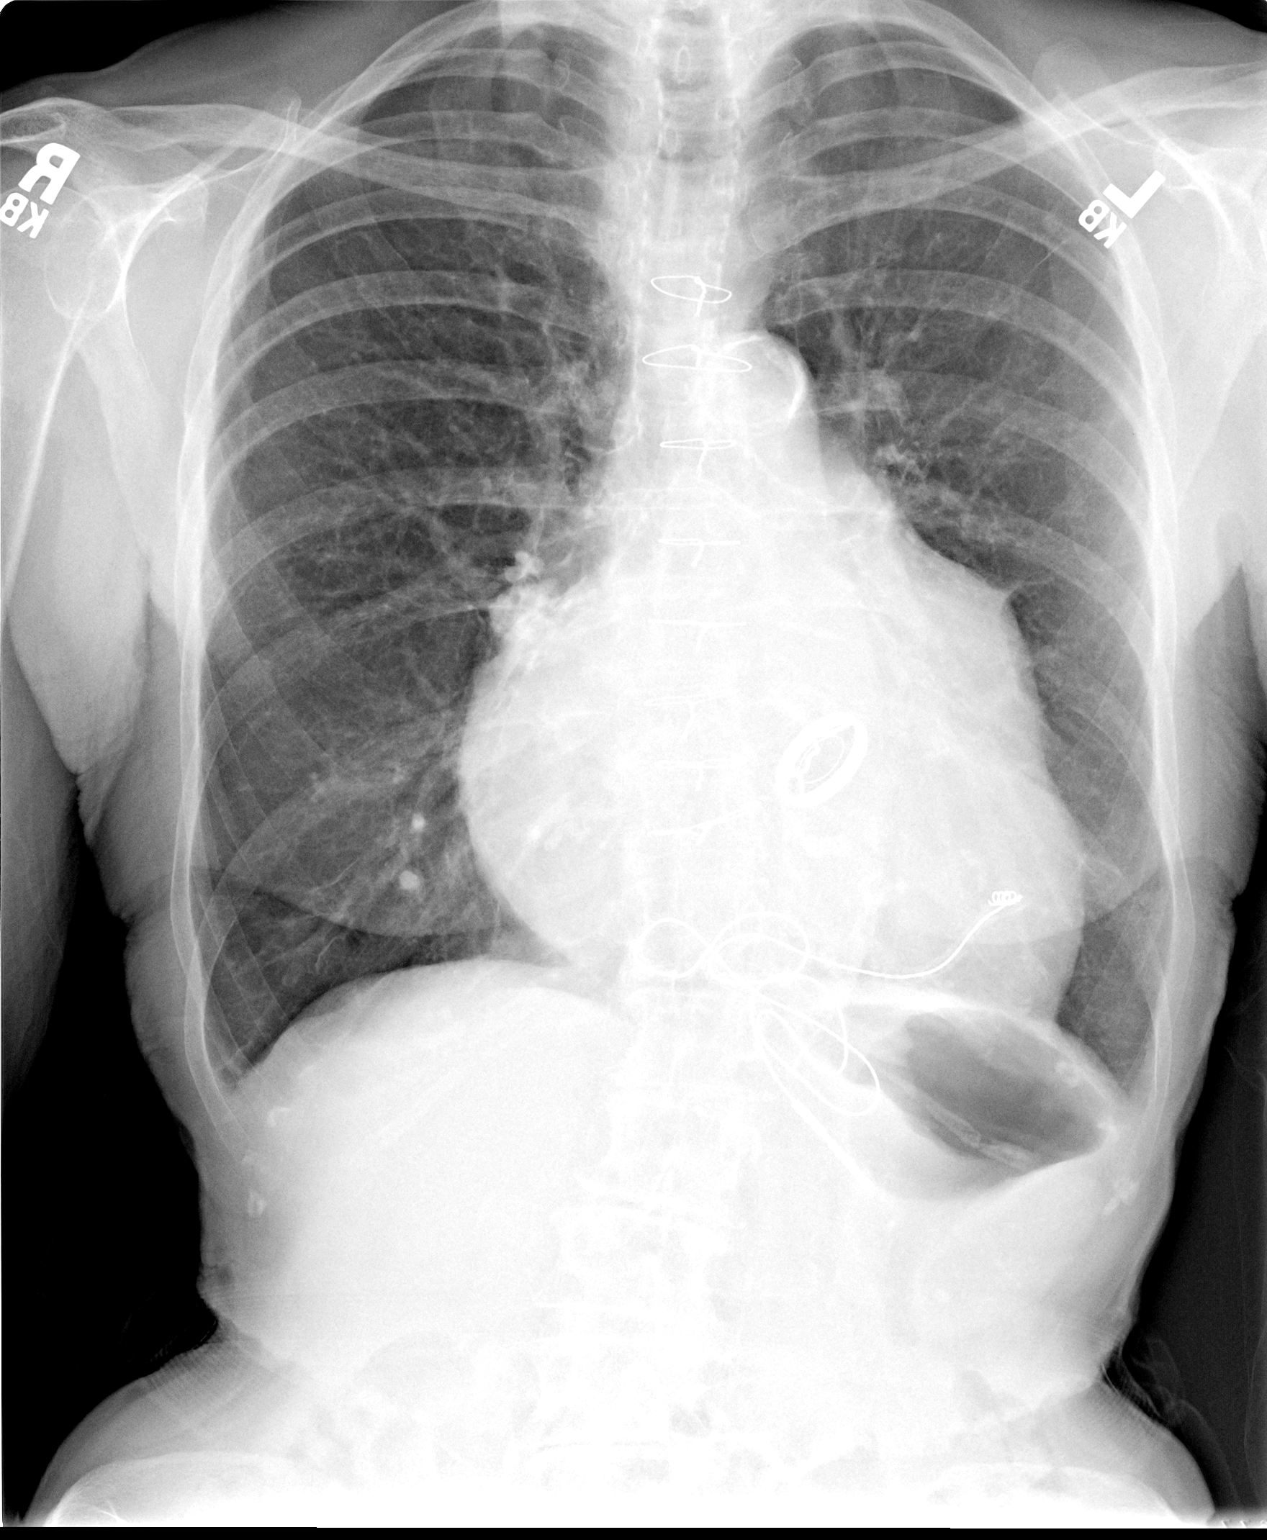

[view not recorded (2 of 2)]
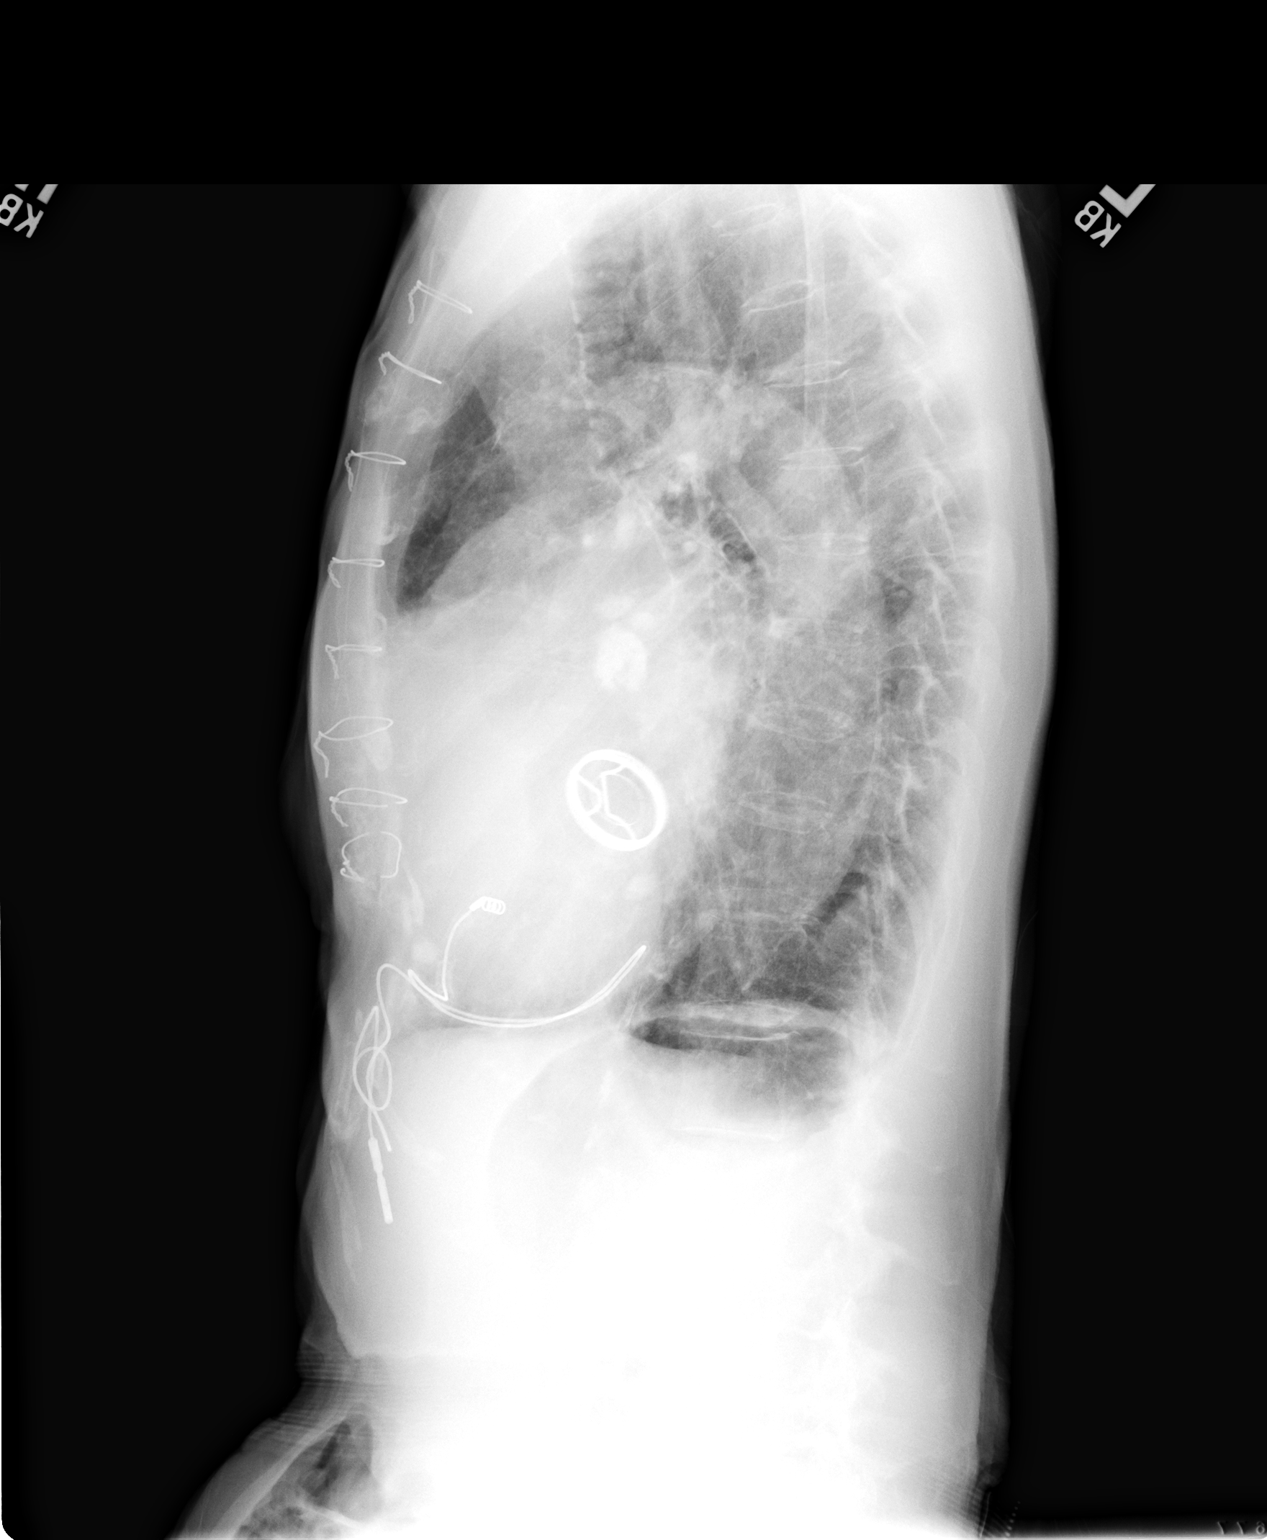

[2 of 2 positions shown; findings below may reference images not displayed]

FINDINGS: Prior median sternotomy and valve replacement.  There is
a globular appearance of the heart with cardiomegaly.  Mild
hyperinflation of the lungs.  Calcified granulomas seen in the
right lower lobe. Calcified right hilar lymph nodes.  No acute
opacities or effusions.
IMPRESSION: Cardiomegaly, stable.

COPD.

Old granulomatous disease.

## 2010-04-29 ENCOUNTER — Ambulatory Visit: Payer: Self-pay | Admitting: Endocrinology

## 2010-04-29 ENCOUNTER — Telehealth (INDEPENDENT_AMBULATORY_CARE_PROVIDER_SITE_OTHER): Payer: Self-pay | Admitting: *Deleted

## 2010-04-29 DIAGNOSIS — M109 Gout, unspecified: Secondary | ICD-10-CM | POA: Insufficient documentation

## 2010-04-29 LAB — CONVERTED CEMR LAB: Uric Acid, Serum: 5.8 mg/dL (ref 2.4–7.0)

## 2010-05-03 ENCOUNTER — Ambulatory Visit: Payer: Self-pay | Admitting: Cardiology

## 2010-05-03 ENCOUNTER — Ambulatory Visit: Payer: Self-pay | Admitting: Cardiovascular Disease

## 2010-05-03 LAB — CONVERTED CEMR LAB
POC INR: 6.7
Prothrombin Time: 83.1 s

## 2010-05-05 LAB — CONVERTED CEMR LAB
BUN: 28 mg/dL — ABNORMAL HIGH (ref 6–23)
Calcium: 8.9 mg/dL (ref 8.4–10.5)
GFR calc non Af Amer: 65.89 mL/min (ref >60)
Glucose, Bld: 170 mg/dL — ABNORMAL HIGH (ref 70–99)
Potassium: 4.1 meq/L (ref 3.5–5.1)
Pro B Natriuretic peptide (BNP): 318.2 pg/mL — ABNORMAL HIGH (ref 0.0–100.0)
Sodium: 139 meq/L (ref 135–145)

## 2010-05-07 ENCOUNTER — Ambulatory Visit: Payer: Self-pay | Admitting: Cardiology

## 2010-05-17 ENCOUNTER — Ambulatory Visit: Payer: Self-pay | Admitting: Cardiology

## 2010-06-03 ENCOUNTER — Ambulatory Visit: Payer: Self-pay | Admitting: Cardiology

## 2010-06-08 ENCOUNTER — Telehealth: Payer: Self-pay | Admitting: Cardiovascular Disease

## 2010-06-14 ENCOUNTER — Ambulatory Visit: Payer: Self-pay | Admitting: Cardiology

## 2010-06-14 ENCOUNTER — Ambulatory Visit: Payer: Self-pay | Admitting: Cardiovascular Disease

## 2010-06-14 DIAGNOSIS — I5032 Chronic diastolic (congestive) heart failure: Secondary | ICD-10-CM

## 2010-06-14 LAB — CONVERTED CEMR LAB: POC INR: 3.2

## 2010-06-23 ENCOUNTER — Ambulatory Visit: Payer: Self-pay | Admitting: Cardiovascular Disease

## 2010-06-28 ENCOUNTER — Ambulatory Visit: Payer: Self-pay | Admitting: Cardiovascular Disease

## 2010-06-29 LAB — CONVERTED CEMR LAB
CO2: 29 meq/L (ref 19–32)
Chloride: 99 meq/L (ref 96–112)
Glucose, Bld: 76 mg/dL (ref 70–99)
Sodium: 139 meq/L (ref 135–145)

## 2010-07-09 ENCOUNTER — Ambulatory Visit: Payer: Self-pay | Admitting: Cardiology

## 2010-07-09 LAB — CONVERTED CEMR LAB: POC INR: 4.5

## 2010-07-23 ENCOUNTER — Ambulatory Visit: Payer: Self-pay | Admitting: Cardiology

## 2010-07-23 LAB — CONVERTED CEMR LAB: POC INR: 4

## 2010-07-27 ENCOUNTER — Ambulatory Visit: Payer: Self-pay | Admitting: Internal Medicine

## 2010-08-04 ENCOUNTER — Telehealth: Payer: Self-pay | Admitting: Internal Medicine

## 2010-08-13 ENCOUNTER — Ambulatory Visit: Payer: Self-pay | Admitting: Internal Medicine

## 2010-08-13 LAB — CONVERTED CEMR LAB: POC INR: 3.8

## 2010-08-26 ENCOUNTER — Ambulatory Visit: Payer: Self-pay | Admitting: Cardiovascular Disease

## 2010-09-03 ENCOUNTER — Ambulatory Visit: Payer: Self-pay | Admitting: Cardiology

## 2010-09-03 LAB — CONVERTED CEMR LAB: POC INR: 3.2

## 2010-09-17 ENCOUNTER — Ambulatory Visit: Payer: Self-pay | Admitting: Cardiovascular Disease

## 2010-09-17 LAB — CONVERTED CEMR LAB: POC INR: 3.3

## 2010-10-01 ENCOUNTER — Ambulatory Visit: Payer: Self-pay | Admitting: Cardiology

## 2010-10-01 LAB — CONVERTED CEMR LAB: POC INR: 4.7

## 2010-10-08 ENCOUNTER — Encounter (INDEPENDENT_AMBULATORY_CARE_PROVIDER_SITE_OTHER): Payer: Self-pay | Admitting: *Deleted

## 2010-10-19 ENCOUNTER — Ambulatory Visit: Payer: Self-pay | Admitting: Cardiology

## 2010-10-20 ENCOUNTER — Encounter: Payer: Self-pay | Admitting: Cardiovascular Disease

## 2010-10-20 ENCOUNTER — Encounter: Payer: Self-pay | Admitting: Internal Medicine

## 2010-11-02 ENCOUNTER — Ambulatory Visit: Payer: Self-pay | Admitting: Internal Medicine

## 2010-11-17 ENCOUNTER — Ambulatory Visit: Payer: Self-pay | Admitting: Cardiology

## 2010-11-30 ENCOUNTER — Ambulatory Visit: Admission: RE | Admit: 2010-11-30 | Discharge: 2010-11-30 | Payer: Self-pay | Source: Home / Self Care

## 2010-12-01 ENCOUNTER — Ambulatory Visit
Admission: RE | Admit: 2010-12-01 | Discharge: 2010-12-01 | Payer: Self-pay | Source: Home / Self Care | Attending: Internal Medicine | Admitting: Internal Medicine

## 2010-12-03 ENCOUNTER — Telehealth: Payer: Self-pay | Admitting: Internal Medicine

## 2010-12-09 DIAGNOSIS — R198 Other specified symptoms and signs involving the digestive system and abdomen: Secondary | ICD-10-CM | POA: Insufficient documentation

## 2010-12-12 ENCOUNTER — Encounter: Payer: Self-pay | Admitting: Internal Medicine

## 2010-12-13 ENCOUNTER — Telehealth: Payer: Self-pay | Admitting: Internal Medicine

## 2010-12-13 ENCOUNTER — Telehealth: Payer: Self-pay | Admitting: Cardiovascular Disease

## 2010-12-14 ENCOUNTER — Telehealth: Payer: Self-pay | Admitting: Internal Medicine

## 2010-12-16 ENCOUNTER — Ambulatory Visit: Admission: RE | Admit: 2010-12-16 | Discharge: 2010-12-16 | Payer: Self-pay | Source: Home / Self Care

## 2010-12-16 LAB — CONVERTED CEMR LAB: POC INR: 3.9

## 2010-12-21 NOTE — Miscellaneous (Signed)
Summary: Outpatient Coinsurance Notice  Outpatient Coinsurance Notice   Imported By: Marylou Mccoy 02/24/2010 14:41:48  _____________________________________________________________________  External Attachment:    Type:   Image     Comment:   External Document

## 2010-12-21 NOTE — Progress Notes (Signed)
Summary: RF  Phone Note Refill Request Message from:  Patient  Refills Requested: Medication #1:  ALPRAZOLAM 0.25 MG TABS take one tab by mouth at bedtime as needed [facility]  Initial call taken by: Lamar Sprinkles, CMA,  August 04, 2010 2:34 PM  Follow-up for Phone Call        ok for refill x 5 Follow-up by: Jacques Navy MD,  August 05, 2010 12:55 PM    Prescriptions: ALPRAZOLAM 0.25 MG TABS (ALPRAZOLAM) take one tab by mouth at bedtime as needed  #30 x 5   Entered by:   Ami Bullins CMA   Authorized by:   Jacques Navy MD   Signed by:   Bill Salinas CMA on 08/05/2010   Method used:   Telephoned to ...       [facility]* (retail)       [address]       [place], [place]  [id number]       Ph: [phone]       Fax: [phone]   RxID:   [id number]

## 2010-12-21 NOTE — Medication Information (Signed)
Summary: rov.mp  Anticoagulant Therapy  Managed by: Cloyde Reams, RN, BSN Referring MD: Tonny Bollman PCP: Illene Regulus, M.D. Supervising MD: Clifton James MD, Cristal Deer Indication 1: Mitral Valve Disorder (ICD-424.0) Indication 2: Cerebral Artery Embolism (ICD-434.1) Lab Used: [facility] Site: [facility] INR POC 3.8 INR RANGE 3.5 - 4  Dietary changes: no    Health status changes: no    Bleeding/hemorrhagic complications: no    Recent/future hospitalizations: no    Any changes in medication regimen? no    Recent/future dental: no  Any missed doses?: no       Is patient compliant with meds? yes       Allergies: 1)  ! Neomycin 2)  ! Tylox 3)  ! Levaquin 4)  ! * Statins  Anticoagulation Management History:      The patient is taking warfarin and comes in today for a routine follow up visit.  Positive risk factors for bleeding include an age of 85 years or older and history of CVA/TIA.  The bleeding index is 'intermediate risk'.  Positive CHADS2 values include Age > 68 years old and Prior Stroke/CVA/TIA.  The start date was 03/23/1998.  Her last INR was 4.4 ratio.  Anticoagulation responsible provider: Clifton James MD, Cristal Deer.  INR POC: 3.8.  Cuvette Lot#: [id number].  Exp: 03/2011.    Anticoagulation Management Assessment/Plan:      The patient's current anticoagulation dose is Coumadin 4 mg tabs: Use as directed per anticoagulation clinic.  The target INR is 3.5 - 4.  The next INR is due 02/26/2010.  Anticoagulation instructions were given to patient.  Results were reviewed/authorized by Cloyde Reams, RN, BSN.  She was notified by Cloyde Reams RN.         Prior Anticoagulation Instructions: INR 4.2  Take 1/2 tablet today then resume same dosage 1 tablet daily except 1/2 tablet on Tuesdays.  Recheck in 3 weeks.    Current Anticoagulation Instructions: INR 3.8  Continue on same dosage 1 tablet daily except 1/2 tablet on Tuesdays.  Recheck in 3 weeks.

## 2010-12-21 NOTE — Medication Information (Signed)
Summary: rov/tm  Anticoagulant Therapy  Managed by: Bethena Midget, RN, BSN Referring MD: Tonny Bollman PCP: Illene Regulus, M.D. Supervising MD: Clifton James MD, Cristal Deer Indication 1: Mitral Valve Disorder (ICD-424.0) Indication 2: Cerebral Artery Embolism (ICD-434.1) Lab Used: [facility] Site: [facility] INR POC 5.0 INR RANGE 3.5 - 4  Dietary changes: yes       Details: Planning to start taking Boost daily  Health status changes: no    Bleeding/hemorrhagic complications: no    Recent/future hospitalizations: no    Any changes in medication regimen? yes       Details: Discontinued Potassium  Recent/future dental: no  Any missed doses?: no       Is patient compliant with meds? yes       Allergies: 1)  ! Neomycin 2)  ! Tylox 3)  ! Levaquin 4)  ! * Statins  Anticoagulation Management History:      The patient is taking warfarin and comes in today for a routine follow up visit.  Positive risk factors for bleeding include an age of 16 years or older and history of CVA/TIA.  The bleeding index is 'intermediate risk'.  Positive CHADS2 values include History of CHF, Age > 21 years old, and Prior Stroke/CVA/TIA.  The start date was 03/23/1998.  Her last INR was 7.8 ratio.  Anticoagulation responsible Adelle Zachar: Clifton James MD, Cristal Deer.  INR POC: 5.0.  Cuvette Lot#: [id number].  Exp: 08/2011.    Anticoagulation Management Assessment/Plan:      The patient's current anticoagulation dose is Coumadin 4 mg tabs: Use as directed per anticoagulation clinic.  The target INR is 3.5 - 4.  The next INR is due 07/09/2010.  Anticoagulation instructions were given to patient.  Results were reviewed/authorized by Bethena Midget, RN, BSN.  She was notified by Liana Gerold, PharmD Candidate.         Prior Anticoagulation Instructions: INR 3.2 Change dose to 1 tablet everyday except 1/2 tablet on Fridays. Recheck in 2 weeks.   Current Anticoagulation Instructions: INR 5.0  Skip 1 dose, then  continue with 1 tablet daily except for 1/2 tablet on Fridays.  Return to clinic in 2 weeks.

## 2010-12-21 NOTE — Medication Information (Signed)
Summary: rov/cb  Anticoagulant Therapy  Managed by: Bethena Midget, RN, BSN Referring MD: Tonny Bollman PCP: Illene Regulus, M.D. Supervising MD: Jens Som MD, Arlys John Indication 1: Mitral Valve Disorder (ICD-424.0) Indication 2: Cerebral Artery Embolism (ICD-434.1) Lab Used: [facility] Site: [facility] INR POC 4.4 INR RANGE 3.5 - 4  Dietary changes: no    Health status changes: no    Bleeding/hemorrhagic complications: no    Recent/future hospitalizations: no    Any changes in medication regimen? no    Recent/future dental: no  Any missed doses?: no       Is patient compliant with meds? yes       Allergies: 1)  ! Neomycin 2)  ! Tylox 3)  ! Levaquin 4)  ! * Statins  Anticoagulation Management History:      The patient is taking warfarin and comes in today for a routine follow up visit.  Positive risk factors for bleeding include an age of 75 years or older and history of CVA/TIA.  The bleeding index is 'intermediate risk'.  Positive CHADS2 values include Age > 55 years old and Prior Stroke/CVA/TIA.  The start date was 03/23/1998.  Her last INR was 7.8 ratio.  Anticoagulation responsible provider: Jens Som MD, Arlys John.  INR POC: 4.4.  Cuvette Lot#: [id number].  Exp: 06/2011.    Anticoagulation Management Assessment/Plan:      The patient's current anticoagulation dose is Coumadin 4 mg tabs: Use as directed per anticoagulation clinic.  The target INR is 3.5 - 4.  The next INR is due 06/03/2010.  Anticoagulation instructions were given to patient.  Results were reviewed/authorized by Bethena Midget, RN, BSN.  She was notified by Bethena Midget, RN, BSN.         Prior Anticoagulation Instructions: INR 1.8. Take 2 tablets today and tomorrow, then take 1 tablet daily except 0.5 tablet on Tuesdays. Recheck on 05/17/10 after vacation.  Current Anticoagulation Instructions: INR 4.4 Today take 2mg s then resume 4mg s everyday except 2mg s on Tuesdays. Recheck in 2-3 weeks.

## 2010-12-21 NOTE — Miscellaneous (Signed)
Summary: Declaration of A Desire For A Natural Death  Declaration of A Desire For A Natural Death   Imported By: Sherian Rein 12/01/2009 12:07:12  _____________________________________________________________________  External Attachment:    Type:   Image     Comment:   External Document

## 2010-12-21 NOTE — Miscellaneous (Signed)
Summary: Orders Update  Clinical Lists Changes  Orders: Added new Test order of T-2 View CXR (71020TC) - Signed 

## 2010-12-21 NOTE — Medication Information (Signed)
Summary: CCR/JSS  Anticoagulant Therapy  Managed by: Bethena Midget, RN, BSN Referring MD: Tonny Bollman PCP: Illene Regulus, M.D. Supervising MD: Antoine Poche MD, Fayrene Fearing Indication 1: Mitral Valve Disorder (ICD-424.0) Indication 2: Cerebral Artery Embolism (ICD-434.1) Lab Used: [facility] Site: [facility] PT 83.1 INR POC 6.7 INR RANGE 3.5 - 4  Dietary changes: no    Health status changes: yes       Details: Gout attack   Bleeding/hemorrhagic complications: no    Recent/future hospitalizations: no    Any changes in medication regimen? yes       Details: Hydrocodone PRN, Medrol 4mg s dose Pack started on Friday   Recent/future dental: no  Any missed doses?: no       Is patient compliant with meds? yes       Allergies: 1)  ! Neomycin 2)  ! Tylox 3)  ! Levaquin 4)  ! * Statins  Anticoagulation Management History:      The patient is taking warfarin and comes in today for a routine follow up visit.  Positive risk factors for bleeding include an age of 62 years or older and history of CVA/TIA.  The bleeding index is 'intermediate risk'.  Positive CHADS2 values include Age > 87 years old and Prior Stroke/CVA/TIA.  The start date was 03/23/1998.  Her last INR was 3.0 ratio and today's INR is 7.8.  Prothrombin time is 83.1.  Anticoagulation responsible provider: Antoine Poche MD, Fayrene Fearing.  INR POC: 6.7.  Cuvette Lot#: [id number].  Exp: 06/2011.    Anticoagulation Management Assessment/Plan:      The patient's current anticoagulation dose is Coumadin 4 mg tabs: Use as directed per anticoagulation clinic.  The target INR is 3.5 - 4.  The next INR is due 05/07/2010.  Anticoagulation instructions were given to patient.  Results were reviewed/authorized by Bethena Midget, RN, BSN.  She was notified by Bethena Midget, RN, BSN.         Prior Anticoagulation Instructions: INR 3.8 Continue 4mg  everyday except 2mg s on Tuesdays. Recheck in 3 weeks.   Current Anticoagulation Instructions: INR 6.7    already took today's dose.  To lab: 7.8 Spoke with pt.  Hold Coumadin until appt on Friday.  Instructed pt to call with any bleeding problems. Weston Brass PharmD  May 03, 2010 5:37 PM

## 2010-12-21 NOTE — Medication Information (Signed)
Summary: rov/sp  Anticoagulant Therapy  Managed by: Elaina Pattee, PharmD Referring MD: Tonny Bollman PCP: Illene Regulus, M.D. Supervising MD: Juanda Chance MD, Alianah Lofton Indication 1: Mitral Valve Disorder (ICD-424.0) Indication 2: Cerebral Artery Embolism (ICD-434.1) Lab Used: [facility] Site: [facility] INR POC 1.8 INR RANGE 3.5 - 4  Dietary changes: no    Health status changes: no    Bleeding/hemorrhagic complications: no    Recent/future hospitalizations: no    Any changes in medication regimen? yes       Details: Medrol DC. KCl restarted.  Recent/future dental: no  Any missed doses?: no       Is patient compliant with meds? yes       Allergies: 1)  ! Neomycin 2)  ! Tylox 3)  ! Levaquin 4)  ! * Statins  Anticoagulation Management History:      The patient is taking warfarin and comes in today for a routine follow up visit.  Positive risk factors for bleeding include an age of 75 years or older and history of CVA/TIA.  The bleeding index is 'intermediate risk'.  Positive CHADS2 values include Age > 39 years old and Prior Stroke/CVA/TIA.  The start date was 03/23/1998.  Her last INR was 7.8 ratio.  Anticoagulation responsible provider: Juanda Chance MD, Smitty Cords.  INR POC: 1.8.  Cuvette Lot#: [id number].  Exp: 06/2011.    Anticoagulation Management Assessment/Plan:      The patient's current anticoagulation dose is Coumadin 4 mg tabs: Use as directed per anticoagulation clinic.  The target INR is 3.5 - 4.  The next INR is due 05/17/2010.  Anticoagulation instructions were given to patient.  Results were reviewed/authorized by Elaina Pattee, PharmD.  She was notified by Elaina Pattee, PharmD.         Prior Anticoagulation Instructions: INR 6.7  already took today's dose.  To lab: 7.8 Spoke with pt.  Hold Coumadin until appt on Friday.  Instructed pt to call with any bleeding problems. Weston Brass PharmD  May 03, 2010 5:37 PM    Current Anticoagulation Instructions: INR 1.8. Take 2  tablets today and tomorrow, then take 1 tablet daily except 0.5 tablet on Tuesdays. Recheck on 05/17/10 after vacation.

## 2010-12-21 NOTE — Progress Notes (Signed)
Summary: PT CALLING REGARDING (FUROSEMIDE)  Phone Note Call from Patient Call back at Home Phone [phone]   Caller: Patient Summary of Call: PT CALLING REGARDING MEDICATION (FUROSEMIDE) Initial call taken by: Judie Grieve,  March 05, 2010 10:41 AM  Follow-up for Phone Call        The pt feels like the trial of lasix 40mg  two times a day and potassium daily helped her symptoms of breathlessness.  I will forward this information to Dr Excell Seltzer for review and further recommendations. Follow-up by: Julieta Gutting, RN, BSN,  March 05, 2010 11:19 AM  Additional Follow-up for Phone Call Additional follow up Details #1::        Spoke with pt. Asked her to continue furosemide 40 mg two times a day and kcl 20 meq daily. will schedule f/u bmet and bnp in about 10-14 days. she has f/u appt scheduled with me in May. Additional Follow-up by: Norva Karvonen, MD,  March 05, 2010 2:04 PM    Additional Follow-up for Phone Call Additional follow up Details #2::    I spoke with the pt and she will come into the office on 03/15/10 for a repeat BMP and BNP. Follow-up by: Julieta Gutting, RN, BSN,  March 05, 2010 2:47 PM  New/Updated Medications: LASIX 40 MG TABS (FUROSEMIDE) Take one tablet by mouth twice a day

## 2010-12-21 NOTE — Medication Information (Signed)
Summary: rov.m  Anticoagulant Therapy  Managed by: Cloyde Reams, RN, BSN Referring MD: Tonny Bollman PCP: Illene Regulus, M.D. Supervising MD: Juanda Chance MD, Jahleel Stroschein Indication 1: Mitral Valve Disorder (ICD-424.0) Indication 2: Cerebral Artery Embolism (ICD-434.1) Lab Used: [facility] Site: [facility] INR POC 3.7 INR RANGE 3.5 - 4  Dietary changes: no    Health status changes: no    Bleeding/hemorrhagic complications: yes       Details: Incr bruising present.    Recent/future hospitalizations: no    Any changes in medication regimen? no    Recent/future dental: no  Any missed doses?: no       Is patient compliant with meds? yes       Allergies (verified): 1)  ! Neomycin 2)  ! Tylox 3)  ! Levaquin 4)  ! * Statins  Anticoagulation Management History:      The patient is taking warfarin and comes in today for a routine follow up visit.  Positive risk factors for bleeding include an age of 75 years or older and history of CVA/TIA.  The bleeding index is 'intermediate risk'.  Positive CHADS2 values include Age > 15 years old and Prior Stroke/CVA/TIA.  The start date was 03/23/1998.  Her last INR was 4.4 ratio.  Anticoagulation responsible provider: Juanda Chance MD, Smitty Cords.  INR POC: 3.7.  Cuvette Lot#: [id number].  Exp: 12/2010.    Anticoagulation Management Assessment/Plan:      The patient's current anticoagulation dose is Coumadin 4 mg tabs: Use as directed per anticoagulation clinic.  The target INR is 3.5 - 4.  The next INR is due 12/22/2009.  Anticoagulation instructions were given to patient.  Results were reviewed/authorized by Cloyde Reams, RN, BSN.  She was notified by Cloyde Reams RN.         Prior Anticoagulation Instructions: INR 3.9  Continue taking 0.5 tab each Tuesday and 1 tab on all other days.  Recheck in 2 - 3 weeks.    Current Anticoagulation Instructions: INR 3.7  Continue on same dosage 1 tablet daily except 1/2 tablet on Tuesdays.  Recheck in 4 weeks.

## 2010-12-21 NOTE — Assessment & Plan Note (Signed)
Summary: ROV   Visit Type:  Follow-up Primary Provider:  Illene Regulus, M.D.  CC:  dyspnea.  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  Her latest echo shows a normal functioning mitral prosthesis, normal LV function, normal estimate of pulmonary pressures, normal RV function, and severe biatrial enlargement.  She is doing well at present and complains of mild exertional dyspnea when walking outside. This is stable over time and has not progressed. She denies edema, orthopnea, or PND. No chest pain or other complaints.  Current Medications (verified): 1)  Aspirin Adult Low Strength 81 Mg Tbec (Aspirin) .... Take 1 Tablet By Mouth Once A Day 2)  Lasix 40 Mg Tabs (Furosemide) .... Take One Tablet By Mouth One Time Per  Day 3)  Multivitamins  Tabs (Multiple Vitamin) .... Take 1 Tablet By Mouth Once A Day 4)  Coumadin 4 Mg Tabs (Warfarin Sodium) .... Use As Directed Per Anticoagulation Clinic 5)  Alprazolam 0.25 Mg Tabs (Alprazolam) .... Take One Tab By Mouth At Bedtime As Needed 6)  Verapamil Hcl Cr 180 Mg Cr-Tabs (Verapamil Hcl) .... Take 1 1/2 Tablet Daily 7)  Biotears Eye Drops .... As Needed For Dry Eyes  Allergies: 1)  ! Neomycin 2)  ! Tylox 3)  ! Levaquin 4)  ! * Statins  Past History:  Past medical history reviewed for relevance to current acute and chronic problems.  Past Medical History: Reviewed history from 06/14/2010 and no changes required. 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 Tonny Bollman                 Neurosurgery - Dr. Newell Coral                 GI - Dr. [name]                 GS - Dr. Johna Sheriff  Review of Systems       Negative except as per HPI   Vital Signs:  Patient profile:   77 year old female Height:      63 inches Weight:      110.50 pounds BMI:     19.64 Pulse rate:   76 / minute Pulse rhythm:   irregular Resp:     18 per minute BP sitting:   124 / 70  (left arm) Cuff size:   large  Vitals Entered By: Vikki Ports (August 26, 2010 10:20 AM)  Physical Exam  General:  Pt is alert and oriented, elderly woman in no acute distress. HEENT: normal Neck: normal carotid upstrokes without bruits, JVP normal Lungs: CTA CV: irregular irregular with a 2/6 systolic murmur at the LSB and normal mechanical mitral closure sound Abd: soft, NT, positive BS, no bruit, no organomegaly Ext: no clubbing, cyanosis,  or edema. peripheral pulses 2+ and equal Skin: warm and dry without rash    EKG  Procedure date:  08/26/2010  Findings:      Atrial fibrillation, ST-T abnormality consider lateral ischemia versus dig effect, HR 76 bpm  Impression & Recommendations:  Problem # 1:  DIASTOLIC HEART FAILURE, CHRONIC (ICD-428.32) Pt appears stable. Will continue current therapy and f/u in 6 months. Volume status looks good.  Her updated medication list for this problem includes:    Aspirin Adult Low Strength 81 Mg Tbec (Aspirin) .Marland Kitchen... Take 1 tablet by mouth once a day    Lasix 40 Mg Tabs (Furosemide) .Marland Kitchen... Take one tablet by mouth one time per  day    Coumadin 4 Mg Tabs (Warfarin sodium) ..... Use as directed per anticoagulation clinic    Verapamil Hcl  Cr 180 Mg Cr-tabs (Verapamil hcl) .Marland Kitchen... Take 1 1/2 tablet daily  Orders: EKG w/ Interpretation (93000)  Problem # 2:  Hx of MITRAL VALVE DISORDERS (ICD-424.0) Stable exam. Continue anticoagulation with warfarin.  Her updated medication list for this problem includes:    Lasix 40 Mg Tabs (Furosemide) .Marland Kitchen... Take one tablet by mouth one time per  day  Orders: EKG w/ Interpretation (93000)  Patient Instructions: 1)  Your physician recommends that you return for lab work in: 6 MONTHS (BMP and BNP 427.31, 414.01) 2)  Your physician recommends that you continue on your current medications as directed. Please refer to the Current Medication list given to you today. 3)  Your physician wants you to follow-up in: 6 MONTHS.  You will receive a reminder letter in the mail two months in advance. If you don't receive a letter, please call our office to schedule the follow-up appointment.

## 2010-12-21 NOTE — Assessment & Plan Note (Signed)
Summary: painful foot/?gout/men/cd   Vital Signs:  Patient profile:   77 year old female Height:      63 inches Weight:      109.50 pounds BMI:     19.47 O2 Sat:      92 % on Room air Temp:     97.4 degrees F oral Pulse rate:   84 / minute BP sitting:   114 / 60  (left arm) Cuff size:   regular  Vitals Entered By: Lucious Groves (April 29, 2010 2:48 PM)  O2 Flow:  Room air CC: C/O right foot pain since last night/?due to gout./kb Is Patient Diabetic? No Pain Assessment Patient in pain? yes     Location: right foot Intensity: 8 Onset of pain  last night   Primary Provider:  Illene Regulus, M.D.  CC:  C/O right foot pain since last night/?due to gout./kb.  History of Present Illness: pt states 1 day of severe pain at the right foot mtp area.  no assoc fever.  she had an injury 1 month ago, but none since then.  similar sxs in the past were due to gout. she cannot take nsaid due to coumadin. she cannot take colchicine, due to gi intolerance in the past.  Current Medications (verified): 1)  Aspirin Adult Low Strength 81 Mg Tbec (Aspirin) 2)  Lasix 40 Mg Tabs (Furosemide) .... Take One Tablet By Mouth Twice A Day 3)  Multivitamins  Tabs (Multiple Vitamin) .... Take 1 Tablet By Mouth Once A Day 4)  Coumadin 4 Mg Tabs (Warfarin Sodium) .... Use As Directed Per Anticoagulation Clinic 5)  Alprazolam 0.25 Mg Tabs (Alprazolam) .... Take One Tab By Mouth At Bedtime As Needed 6)  Verapamil Hcl Cr 180 Mg Cr-Tabs (Verapamil Hcl) .... Take 1 1/2 Tablet Daily 7)  Biotears Eye Drops .... As Needed For Dry Eyes 8)  Potassium Chloride Crys Cr 20 Meq Cr-Tabs (Potassium Chloride Crys Cr) .... Take One Tablet By Mouth Daily 9)  Amoxicillin 500 Mg Caps (Amoxicillin) .... Take 4 Capsules 1 Hour Before Appointment  Allergies (verified): 1)  ! Neomycin 2)  ! Tylox 3)  ! Levaquin 4)  ! * Statins  Past History:  Past Medical History: Last updated: 09/17/2009 Hx of BACTERIAL PNEUMONIA  (ICD-482.9) Hx of HERPES ZOSTER WITHOUT MENTION OF COMPLICATION (ICD-053.9) Hx of GOUT (ICD-274.9) Hx of CVA (ICD-434.91) Hx of ACUTE GASTRITIS WITHOUT MENTION OF HEMORRHAGE (ICD-535.00) Hx of NEURALGIA, TRIGEMINAL (ICD-350.1) STRICTURE AND STENOSIS OF ESOPHAGUS (ICD-530.3) GERD (ICD-530.81) Hx of MITRAL VALVE DISORDERS (ICD-424.0) Hx of PERITONITIS (ICD-567.9) RHEUMATIC FEVER WITH HEART INVOLVEMENT (ICD-391.9) TIA (ICD-435.9) ATRIAL FIBRILLATION (ICD-427.31) MITRAL VALVE REPLACEMENT, HX OF (ICD-V15.1)    Physician Roster:                 cardiology - Dr. Tonny Bollman                 Neurosurgery - Dr. Newell Coral                 GI - Dr. [name]                 GS - Dr. Johna Sheriff  Review of Systems       denies n/v/numbness.  Physical Exam  General:  normal appearance.   Extremities:  there is erythema, tenderness, swelling, and warmth centered about the right mtp Additional Exam:  Uric Acid  5.8 mg/dL     Impression & Recommendations:  Problem # 1:  ACUTE GOUTY ARTHROPATHY (ICD-274.01) recurrence  Problem # 2:  Hx of MITRAL VALVE DISORDERS (ICD-424.0) coumadin precludes nsaid  Problem # 3:  diarrhea this limits colchicine rx  Problem # 4:  anxiety xanax can interfere with vicodin  Medications Added to Medication List This Visit: 1)  Medrol (pak) 4 Mg Tabs (Methylprednisolone) .... As dir 2)  Hydrocodone-acetaminophen 10-325 Mg Tabs (Hydrocodone-acetaminophen) .... 1/2-1 every 4 hrx as needed for gout symptoms 3)  Prednisone (pak) 10 Mg Tabs (Prednisone) .... As dir  Other Orders: TLB-Uric Acid, Blood (84550-URIC) Est. Patient Level IV ([address])  Patient Instructions: 1)  rest and elevate right foot. 2)  medrol "dosepack." 3)  blood tests are being ordered for you today.  please call [phone] to hear your test results. 4)  vicodin 1/2 or 1 every 4 hrs as needed for pain.  do not take alprazolam with this. Prescriptions: PREDNISONE (PAK) 10 MG  TABS (PREDNISONE) as dir  #1 pack x 0   Entered and Authorized by:   Minus Breeding MD   Signed by:   Minus Breeding MD on 04/29/2010   Method used:   Electronically to        [facility]* (retail)       [address]       [place], [place]  [address]       Ph: [phone]       Fax: [phone]   RxID:   [id number] HYDROCODONE-ACETAMINOPHEN 10-325 MG TABS (HYDROCODONE-ACETAMINOPHEN) 1/2-1 every 4 hrx as needed for gout symptoms  #20 x 0   Entered and Authorized by:   Minus Breeding MD   Signed by:   Minus Breeding MD on 04/29/2010   Method used:   Print then Give to Patient   RxID:   [id number] MEDROL (PAK) 4 MG TABS (METHYLPREDNISOLONE) as dir  #1 pack x 0   Entered and Authorized by:   Minus Breeding MD   Signed by:   Minus Breeding MD on 04/29/2010   Method used:   Electronically to        [facility]* (retail)       [address]       [place], [place]  [id number]       Ph: [phone]       Fax: [phone]   RxID:   [id number]

## 2010-12-21 NOTE — Progress Notes (Signed)
Summary: refill  Phone Note Refill Request   Refills Requested: Medication #1:  LASIX 40 MG TABS Take one tablet by mouth twice a day   Supply Requested: 3 months [employer]   Method Requested: Telephone to Pharmacy Initial call taken by: Migdalia Dk,  March 09, 2010 1:47 PM    Prescriptions: LASIX 40 MG TABS (FUROSEMIDE) Take one tablet by mouth twice a day  #180 x 3   Entered by:   Danielle Rankin, CMA   Authorized by:   Verne Carrow, MD   Signed by:   Danielle Rankin, CMA on 03/09/2010   Method used:   Electronically to        [facility]* (retail)       [address]       [place], [place]  [address]       Ph: [phone]       Fax: [phone]   RxID:   [id number]

## 2010-12-21 NOTE — Medication Information (Signed)
Summary: rov/tm  Anticoagulant Therapy  Managed by: Bethena Midget, RN, BSN Referring MD: Tonny Bollman PCP: Illene Regulus, M.D. Supervising MD: Myrtis Ser MD, Tinnie Gens Indication 1: Mitral Valve Disorder (ICD-424.0) Indication 2: Cerebral Artery Embolism (ICD-434.1) Lab Used: [facility] Site: [facility] INR POC 2.7 INR RANGE 3.5 - 4  Dietary changes: yes       Details: Ate alittle less green leafy veggies  Health status changes: no    Bleeding/hemorrhagic complications: no    Recent/future hospitalizations: no    Any changes in medication regimen? no    Recent/future dental: no  Any missed doses?: no       Is patient compliant with meds? yes       Allergies: 1)  ! Neomycin 2)  ! Tylox 3)  ! Levaquin 4)  ! * Statins  Anticoagulation Management History:      The patient is taking warfarin and comes in today for a routine follow up visit.  Positive risk factors for bleeding include an age of 59 years or older and history of CVA/TIA.  The bleeding index is 'intermediate risk'.  Positive CHADS2 values include History of CHF, Age > 72 years old, and Prior Stroke/CVA/TIA.  The start date was 03/23/1998.  Her last INR was 7.8 ratio.  Anticoagulation responsible provider: Myrtis Ser MD, Tinnie Gens.  INR POC: 2.7.  Cuvette Lot#: [id number].  Exp: 10/2011.    Anticoagulation Management Assessment/Plan:      The patient's current anticoagulation dose is Coumadin 4 mg tabs: Use as directed per anticoagulation clinic.  The target INR is 3.5 - 4.  The next INR is due 11/02/2010.  Anticoagulation instructions were given to patient.  Results were reviewed/authorized by Bethena Midget, RN, BSN.  She was notified by Bethena Midget, RN, BSN.         Prior Anticoagulation Instructions: INR 4.7 Skip today's dose then change dose to 1  tablet daily except 1/2 tablet on Mondays and Fridays. Recheck in 2 weeks.   Current Anticoagulation Instructions: INR 2.7 Today take 1.5 tablets and Wednesday,  then resume 1  tablet everyday except 1/2 tablet on Mondays and Fridays. Recheck in 2 weeks.

## 2010-12-21 NOTE — Medication Information (Signed)
Summary: rov/ewj  Anticoagulant Therapy  Managed by: Bethena Midget, RN, BSN Referring MD: Tonny Bollman PCP: Illene Regulus, M.D. Supervising MD: Daleen Squibb MD, Maisie Fus Indication 1: Mitral Valve Disorder (ICD-424.0) Indication 2: Cerebral Artery Embolism (ICD-434.1) Lab Used: [facility] Site: [facility] INR POC 3.9 INR RANGE 3.5 - 4  Dietary changes: no    Health status changes: no    Bleeding/hemorrhagic complications: no    Recent/future hospitalizations: no    Any changes in medication regimen? no    Recent/future dental: no  Any missed doses?: no       Is patient compliant with meds? yes       Allergies: 1)  ! Neomycin 2)  ! Tylox 3)  ! Levaquin 4)  ! * Statins  Anticoagulation Management History:      The patient is taking warfarin and comes in today for a routine follow up visit.  Positive risk factors for bleeding include an age of 75 years or older and history of CVA/TIA.  The bleeding index is 'intermediate risk'.  Positive CHADS2 values include Age > 65 years old and Prior Stroke/CVA/TIA.  The start date was 03/23/1998.  Her last INR was 4.4 ratio.  Anticoagulation responsible provider: Daleen Squibb MD, Maisie Fus.  INR POC: 3.9.  Cuvette Lot#: [id number].  Exp: 12/2010.    Anticoagulation Management Assessment/Plan:      The patient's current anticoagulation dose is Coumadin 4 mg tabs: Use as directed per anticoagulation clinic.  The target INR is 3.5 - 4.  The next INR is due 01/01/2010.  Anticoagulation instructions were given to patient.  Results were reviewed/authorized by Bethena Midget, RN, BSN.  She was notified by Bethena Midget, RN, BSN.         Prior Anticoagulation Instructions: INR 3.7  Continue on same dosage 1 tablet daily except 1/2 tablet on Tuesdays.  Recheck in 4 weeks.    Current Anticoagulation Instructions: INR 3.9 Continue 4mg s daily except 2mg s on Tuesdays. Recheck in 3 weeks.

## 2010-12-21 NOTE — Medication Information (Signed)
Summary: rov/tm  Anticoagulant Therapy  Managed by: Weston Brass, PharmD Referring MD: Tonny Bollman PCP: Illene Regulus, M.D. Supervising MD: Jens Som MD, Arlys John Indication 1: Mitral Valve Disorder (ICD-424.0) Indication 2: Cerebral Artery Embolism (ICD-434.1) Lab Used[facility]  Site: [facility] INR POC 5.3 INR RANGE 3.5 - 4  Dietary changes: yes       Details: has increased green vegetable intake  Health status changes: no    Bleeding/hemorrhagic complications: no    Recent/future hospitalizations: no    Any changes in medication regimen? no    Recent/future dental: no  Any missed doses?: no       Is patient compliant with meds? yes       Allergies: 1)  ! Neomycin 2)  ! Tylox 3)  ! Levaquin 4)  ! * Statins  Anticoagulation Management History:      The patient is taking warfarin and comes in today for a routine follow up visit.  Positive risk factors for bleeding include an age of 75 years or older and history of CVA/TIA.  The bleeding index is 'intermediate risk'.  Positive CHADS2 values include Age > 63 years old and Prior Stroke/CVA/TIA.  The start date was 03/23/1998.  Her last INR was 7.8 ratio.  Anticoagulation responsible provider: Jens Som MD, Arlys John.  INR POC: 5.3.  Cuvette Lot#: [id number].  Exp: 07/2011.    Anticoagulation Management Assessment/Plan:      The patient's current anticoagulation dose is Coumadin 4 mg tabs: Use as directed per anticoagulation clinic.  The target INR is 3.5 - 4.  The next INR is due 06/16/2010.  Anticoagulation instructions were given to patient.  Results were reviewed/authorized by Weston Brass, PharmD.  She was notified by Dillard Cannon.         Prior Anticoagulation Instructions: INR 4.4 Today take 2mg s then resume 4mg s everyday except 2mg s on Tuesdays. Recheck in 2-3 weeks.   Current Anticoagulation Instructions: INR 5.3  Hold coumadin today and take 1/2 tab on Friday.  Then resume normal regimen of 1 tab daily except 1/2 tab  on Tuesday and Friday.  Re-check INR in 2 weeks.

## 2010-12-21 NOTE — Medication Information (Signed)
Summary: rov/tm  Anticoagulant Therapy  Managed by: Weston Brass, PharmD Referring MD: Tonny Bollman PCP: Illene Regulus, M.D. Supervising MD: Daleen Squibb MD, Maisie Fus Indication 1: Mitral Valve Disorder (ICD-424.0) Indication 2: Cerebral Artery Embolism (ICD-434.1) Lab Used: [facility] Site: [facility] INR POC 3.6 INR RANGE 3.5 - 4  Dietary changes: no    Health status changes: no    Bleeding/hemorrhagic complications: no    Recent/future hospitalizations: no    Any changes in medication regimen? no    Recent/future dental: no  Any missed doses?: no       Is patient compliant with meds? yes       Allergies: 1)  ! Neomycin 2)  ! Tylox 3)  ! Levaquin 4)  ! * Statins  Anticoagulation Management History:      The patient is taking warfarin and comes in today for a routine follow up visit.  Positive risk factors for bleeding include an age of 2 years or older and history of CVA/TIA.  The bleeding index is 'intermediate risk'.  Positive CHADS2 values include Age > 5 years old and Prior Stroke/CVA/TIA.  The start date was 03/23/1998.  Her last INR was 3.0 ratio.  Anticoagulation responsible provider: Daleen Squibb MD, Maisie Fus.  INR POC: 3.6.  Cuvette Lot#: [id number].  Exp: 04/2011.    Anticoagulation Management Assessment/Plan:      The patient's current anticoagulation dose is Coumadin 4 mg tabs: Use as directed per anticoagulation clinic.  The target INR is 3.5 - 4.  The next INR is due 04/08/2010.  Anticoagulation instructions were given to patient.  Results were reviewed/authorized by Weston Brass, PharmD.  She was notified by Weston Brass PharmD.         Prior Anticoagulation Instructions: INR 4.1 Friday take 2mg s then continue 4mg s daily except 2mg s on Tuesdays. Recheck in 3 weeks. Will incorporate alittle more leafy green veggies in diet.   Current Anticoagulation Instructions: INR 3.6  Continue same dose of 1 tablet every day except 1/2 tablet on Tuesday

## 2010-12-21 NOTE — Progress Notes (Signed)
  Phone Note Call from Patient Call back at Home Phone [phone]   Caller:  David/grandson Call For: Dr Debby Bud Summary of Call: Right foot swollen, unable to walk, moderate amount of pain, grandson called and thinks it may be gout, requesting appt today,requesting Dr Debby Bud only, please advise.  Initial call taken by: Verdell Face,  April 29, 2010 12:11 PM  Follow-up for Phone Call        Add to 4:30 work in apt today please Follow-up by: Lamar Sprinkles, CMA,  April 29, 2010 12:26 PM  Additional Follow-up for Phone Call Additional follow up Details #1::        pt seeing Dr Everardo All at 2:45pm today. Additional Follow-up by: Verdell Face,  April 29, 2010 12:53 PM

## 2010-12-21 NOTE — Progress Notes (Signed)
Summary:  echo  Phone Note Call from Patient Call back at Home Phone [phone]   Caller: Patient Reason for Call: Talk to Nurse, Lab or Test Results Details for Reason: echo. Initial call taken by: Lorne Skeens,  February 17, 2010 3:14 PM  Follow-up for Phone Call        pt calling again, request results, Migdalia Dk  February 19, 2010 3:43 PM   Additional Follow-up for Phone Call Additional follow up Details #1::        Spoke with patient. Advised labwork: PT/INR, BMET, BNP, and CXR to further eval. Will schedule FOV after labs done to review. Additional Follow-up by: Norva Karvonen, MD,  February 19, 2010 5:59 PM    Additional Follow-up for Phone Call Additional follow up Details #2::    Appointments entered into [facility]. Julieta Gutting, RN, BSN  February 19, 2010 6:34 PM

## 2010-12-21 NOTE — Medication Information (Signed)
Summary: rov/ewj  Anticoagulant Therapy  Managed by: Cloyde Reams, RN, BSN Referring MD: Tonny Bollman PCP: Illene Regulus, M.D. Supervising MD: Tenny Craw MD, Gunnar Fusi Indication 1: Mitral Valve Disorder (ICD-424.0) Indication 2: Cerebral Artery Embolism (ICD-434.1) Lab Used: [facility] Site: [facility] INR POC 4.2 INR RANGE 3.5 - 4  Dietary changes: no    Health status changes: no    Bleeding/hemorrhagic complications: no    Recent/future hospitalizations: no    Any changes in medication regimen? no    Recent/future dental: no  Any missed doses?: no       Is patient compliant with meds? yes       Allergies: 1)  ! Neomycin 2)  ! Tylox 3)  ! Levaquin 4)  ! * Statins  Anticoagulation Management History:      The patient is taking warfarin and comes in today for a routine follow up visit.  Positive risk factors for bleeding include an age of 9 years or older and history of CVA/TIA.  The bleeding index is 'intermediate risk'.  Positive CHADS2 values include Age > 25 years old and Prior Stroke/CVA/TIA.  The start date was 03/23/1998.  Her last INR was 4.4 ratio.  Anticoagulation responsible provider: Tenny Craw MD, Gunnar Fusi.  INR POC: 4.2.  Cuvette Lot#: [id number].  Exp: 03/2011.    Anticoagulation Management Assessment/Plan:      The patient's current anticoagulation dose is Coumadin 4 mg tabs: Use as directed per anticoagulation clinic.  The target INR is 3.5 - 4.  The next INR is due 02/12/2010.  Anticoagulation instructions were given to patient.  Results were reviewed/authorized by Cloyde Reams, RN, BSN.  She was notified by Cloyde Reams RN.         Prior Anticoagulation Instructions: INR 3.4  Continue on same dosage 4mg  daily except 2mg  on Tuesdays.  Recheck in 3 weeks.    Current Anticoagulation Instructions: INR 4.2  Take 1/2 tablet today then resume same dosage 1 tablet daily except 1/2 tablet on Tuesdays.  Recheck in 3 weeks.

## 2010-12-21 NOTE — Assessment & Plan Note (Signed)
Summary: 6 month rov   Visit Type:  Follow-up Primary Provider:  Illene Regulus, M.D.  CC:  edema in ankles.  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 She has noted slowly progressive DOE over the last several months. Follow-up echo shows a normal functioning mitral prosthesis, normal LV function, normal estimate of pulmonary pressures, normal RV function, and severe biatrial enlargement.  She has leg swelling, but this has been improved on oral furosemide. Denies PND, chest pain, or syncope. Admits to chronic orthopnea.  Current Medications (verified): 1)  Aspirin Adult Low Strength 81 Mg Tbec (Aspirin) 2)  Lasix 40 Mg Tabs (Furosemide) .... Take One Tablet By Mouth Twice A Day 3)  Multivitamins  Tabs (Multiple Vitamin) .... Take 1 Tablet By Mouth Once A Day 4)  Coumadin 4 Mg Tabs (Warfarin Sodium) .... Use As Directed Per Anticoagulation Clinic 5)  Alprazolam 0.25 Mg Tabs (Alprazolam) .... Take One Tab By Mouth At Bedtime As Needed 6)  Verapamil Hcl Cr 180 Mg Cr-Tabs (Verapamil Hcl) .... Take 1 1/2 Tablet Daily 7)  Biotears Eye Drops .... As Needed For Dry Eyes 8)  Potassium Chloride Crys Cr 20 Meq Cr-Tabs (Potassium Chloride Crys Cr) .... Take One Tablet By Mouth Daily  Allergies: 1)  ! Neomycin 2)  ! Tylox 3)  ! Levaquin 4)  ! * Statins  Past History:  Past medical history reviewed for relevance to current acute and chronic problems.  Past Medical History: Reviewed history from 09/17/2009 and no changes required. Hx of BACTERIAL PNEUMONIA (ICD-482.9) Hx of HERPES ZOSTER WITHOUT MENTION OF COMPLICATION (ICD-053.9) Hx of GOUT (ICD-274.9) Hx of CVA (ICD-434.91) Hx of ACUTE GASTRITIS WITHOUT MENTION OF HEMORRHAGE (ICD-535.00) Hx of NEURALGIA, TRIGEMINAL (ICD-350.1) STRICTURE AND STENOSIS OF  ESOPHAGUS (ICD-530.3) GERD (ICD-530.81) Hx of MITRAL VALVE DISORDERS (ICD-424.0) Hx of PERITONITIS (ICD-567.9) RHEUMATIC FEVER WITH HEART INVOLVEMENT (ICD-391.9) TIA (ICD-435.9) ATRIAL FIBRILLATION (ICD-427.31) MITRAL VALVE REPLACEMENT, HX OF (ICD-V15.1)    Physician Roster:                 cardiology - Dr. Tonny Bollman                 Neurosurgery - Dr. Newell Coral                 GI - Dr. [name]                 GS - Dr. Johna Sheriff  Review of Systems       Negative except as per HPI   Vital Signs:  Patient profile:   77 year old female Height:      63 inches Weight:      111 pounds BMI:     19.73 Pulse rate:   76 / minute Resp:     16 per minute BP sitting:   100 / 64  Vitals Entered By: Jacquelin Hawking, CMA (Mar 30, 2010 12:28 PM)  Physical Exam  General:  Pt is alert and oriented, thin, elderly woman, in no acute distress. HEENT: normal Neck: normal carotid upstrokes without bruits, JVP normal Lungs: CTA CV: irregularly irregular with normal prosthetic mitral closure sounds. No diastolic murmur appreciated. Abd: soft, NT, positive BS, no bruit, no organomegaly Ext: trace bilateral pretibial edema. peripheral pulses 2+ and equal Skin: warm and dry without rash    Impression &  Recommendations:  Problem # 1:  Hx of MITRAL VALVE DISORDERS (ICD-424.0) Pt with NYHA Class 2-3 symptoms. Overall she has stabilized on daily furosemide. Echo results reviewed and no obvious etiology for shortness of breath, but suspect longstanding mitral disease and aging ventricle playing a role in her dyspnea. No evidence of pulmonary HTN. Will continue current Rx. Follow-up BMET and BNP in one month and clinical f/u in 2 months.  Her updated medication list for this problem includes:    Lasix 40 Mg Tabs (Furosemide) .Marland Kitchen... Take one tablet by mouth twice a day  Problem # 2:  ATRIAL FIBRILLATION (ICD-427.31) Needs lifelong anticoagulation with mitral prosthesis, history of stroke.  Her  updated medication list for this problem includes:    Aspirin Adult Low Strength 81 Mg Tbec (Aspirin)    Coumadin 4 Mg Tabs (Warfarin sodium) ..... Use as directed per anticoagulation clinic  Patient Instructions: 1)  Your physician recommends that you schedule a follow-up appointment in: 2 MONTHS 2)  Your physician recommends that you return for lab work in: 1 MONTH (BMP and BNP 424.0, 427.31) 3)  Your physician recommends that you continue on your current medications as directed. Please refer to the Current Medication list given to you today. Prescriptions: AMOXICILLIN 500 MG CAPS (AMOXICILLIN) take 4 capsules 1 hour before appointment  #12 x 2   Entered by:   Julieta Gutting, RN, BSN   Authorized by:   Norva Karvonen, MD   Signed by:   Julieta Gutting, RN, BSN on 03/30/2010   Method used:   Electronically to        [facility]* (retail)       [address]       [place], [place]  [id number]       Ph: [phone]       Fax: [phone]   RxID:   [id number]

## 2010-12-21 NOTE — Medication Information (Signed)
Summary: rov/ewj  Anticoagulant Therapy  Managed by: Reina Fuse, PharmD Referring MD: Tonny Bollman PCP: Illene Regulus, M.D. Supervising MD: Excell Seltzer MD, Casimiro Needle Indication 1: Mitral Valve Disorder (ICD-424.0) Indication 2: Cerebral Artery Embolism (ICD-434.1) Lab Used: [facility] Site: [facility] INR POC 3.3 INR RANGE 3.5 - 4  Dietary changes: no    Health status changes: no    Bleeding/hemorrhagic complications: no    Recent/future hospitalizations: no    Any changes in medication regimen? no    Recent/future dental: no  Any missed doses?: no       Is patient compliant with meds? yes       Allergies: 1)  ! Neomycin 2)  ! Tylox 3)  ! Levaquin 4)  ! * Statins  Anticoagulation Management History:      The patient is taking warfarin and comes in today for a routine follow up visit.  Positive risk factors for bleeding include an age of 41 years or older and history of CVA/TIA.  The bleeding index is 'intermediate risk'.  Positive CHADS2 values include History of CHF, Age > 11 years old, and Prior Stroke/CVA/TIA.  The start date was 03/23/1998.  Her last INR was 7.8 ratio.  Anticoagulation responsible provider: Excell Seltzer MD, Casimiro Needle.  INR POC: 3.3.  Cuvette Lot#: [id number].  Exp: 09/2011.    Anticoagulation Management Assessment/Plan:      The patient's current anticoagulation dose is Coumadin 4 mg tabs: Use as directed per anticoagulation clinic.  The target INR is 3.5 - 4.  The next INR is due 10/01/2010.  Anticoagulation instructions were given to patient.  Results were reviewed/authorized by Reina Fuse, PharmD.  She was notified by Reina Fuse PharmD.         Prior Anticoagulation Instructions: INR 3.2  Take an additional 1/2 tablet today, then resume same dosage 1 tablet daily except 1/2 tablet on Tuesdays and Fridays.  Recheck in 2 weeks.    Current Anticoagulation Instructions: INR 3.3  Tomorrow, Saturday, October 29th, take Coumadin 1.5 tab (4 mg). Then, take  Coumadin 1 tab (4 mg) on all days except Coumadin 0.5 tab (2 mg) on Fridays. Return to clinic in 2 weeks.

## 2010-12-21 NOTE — Medication Information (Signed)
Summary: rov/jm  Anticoagulant Therapy  Managed by: Weston Brass, PharmD Referring MD: Tonny Bollman PCP: Illene Regulus, M.D. Supervising MD: Riley Kill MD, Maisie Fus Indication 1: Mitral Valve Disorder (ICD-424.0) Indication 2: Cerebral Artery Embolism (ICD-434.1) Lab Used: [facility] Site: [facility] INR POC 3.2 INR RANGE 3.5 - 4  Dietary changes: no    Health status changes: no    Bleeding/hemorrhagic complications: no    Recent/future hospitalizations: no    Any changes in medication regimen? no    Recent/future dental: no  Any missed doses?: no       Is patient compliant with meds? yes       Allergies: 1)  ! Neomycin 2)  ! Tylox 3)  ! Levaquin 4)  ! * Statins  Anticoagulation Management History:      The patient is taking warfarin and comes in today for a routine follow up visit.  Positive risk factors for bleeding include an age of 14 years or older and history of CVA/TIA.  The bleeding index is 'intermediate risk'.  Positive CHADS2 values include History of CHF, Age > 62 years old, and Prior Stroke/CVA/TIA.  The start date was 03/23/1998.  Her last INR was 7.8 ratio.  Anticoagulation responsible provider: Riley Kill MD, Maisie Fus.  INR POC: 3.2.  Cuvette Lot#: [id number].  Exp: 09/2011.    Anticoagulation Management Assessment/Plan:      The patient's current anticoagulation dose is Coumadin 4 mg tabs: Use as directed per anticoagulation clinic.  The target INR is 3.5 - 4.  The next INR is due 09/17/2010.  Anticoagulation instructions were given to patient.  Results were reviewed/authorized by Weston Brass, PharmD.  She was notified by Weston Brass PharmD.         Prior Anticoagulation Instructions: INR 3.8  Continue taking one tablet every day except for one-half tablet on Tuesday and Friday.  We will see you in three weeks.   Current Anticoagulation Instructions: INR 3.2  Take an additional 1/2 tablet today, then resume same dosage 1 tablet daily except 1/2 tablet on  Tuesdays and Fridays.  Recheck in 2 weeks.

## 2010-12-21 NOTE — Progress Notes (Signed)
Summary: req call back  Phone Note Call from Patient Call back at Home Phone [phone]   Caller: Patient Reason for Call: Talk to Nurse Summary of Call: req to speak to Shelby Dubin about getting lab work done @ [facility] Initial call taken by: Migdalia Dk,  December 10, 2009 3:06 PM  Follow-up for Phone Call        Called spoke with pt.  Pt concerned has appt to check INR on 12/16/09, and they are anticipating bad weather 12/15/09.  Pt states she lives close to [place] office, if bad weather can she just go to [place] office to have PT/INR done?  Told her that should be fine if they are open.  Advised pt TCB for lab order to be entered if unable to get to scheduled appt on 1/26 d/t inclement weather.   Follow-up by: Cloyde Reams RN,  December 10, 2009 4:14 PM

## 2010-12-21 NOTE — Letter (Signed)
Summary: New Patient letter  [facility]  [address] [place], [place] [address]   Phone: [phone]  Fax: [phone]       10/08/2010 MRN: [id number]  Doris [name] [address] [place], [place]  [address]  Dear Ms. Wintle,  Welcome to the Gastroenterology Division at [facility].    You are scheduled to see Dr. [name] on 12/01/2010 at 3:30PM on the 3rd floor at [facility], [address].  We ask that you try to arrive at our office 15 minutes prior to your appointment time to allow for check-in.  We would like you to complete the enclosed self-administered evaluation form prior to your visit and bring it with you on the day of your appointment.  We will review it with you.  Also, please bring a complete list of all your medications or, if you prefer, bring the medication bottles and we will list them.  Please bring your insurance card so that we may make a copy of it.  If your insurance requires a referral to see a specialist, please bring your referral form from your primary care physician.  Co-payments are due at the time of your visit and may be paid by cash, check or credit card.     Your office visit will consist of a consult with your physician (includes a physical exam), any laboratory testing he/she may order, scheduling of any necessary diagnostic testing (e.g. x-ray, ultrasound, CT-scan), and scheduling of a procedure (e.g. Endoscopy, Colonoscopy) if required.  Please allow enough time on your schedule to allow for any/all of these possibilities.    If you cannot keep your appointment, please call [phone] to cancel or reschedule prior to your appointment date.  This allows [place] the opportunity to schedule an appointment for another patient in need of care.  If you do not cancel or reschedule by 5 p.m. the business day prior to your appointment date, you will be charged a $50.00 late cancellation/no-show fee.    Thank you for choosing  [facility] for your medical needs.  We appreciate the opportunity to care for you.  Please visit [place] at our website  to learn more about our practice.                     Sincerely,                                                             The Gastroenterology Division

## 2010-12-21 NOTE — Medication Information (Signed)
Summary: rov/tm  Anticoagulant Therapy  Managed by: Cloyde Reams, RN, BSN Referring MD: Tonny Bollman PCP: Illene Regulus, M.D. Supervising MD: Tenny Craw MD, Gunnar Fusi Indication 1: Mitral Valve Disorder (ICD-424.0) Indication 2: Cerebral Artery Embolism (ICD-434.1) Lab Used: [facility] Site: [facility] INR POC 3.4 INR RANGE 3.5 - 4  Dietary changes: no    Health status changes: yes       Details: Episode of incr SOB when lays down in bed.  Called Dr Excell Seltzer, msg in EMR.  Bleeding/hemorrhagic complications: no    Recent/future hospitalizations: no    Any changes in medication regimen? no    Recent/future dental: no  Any missed doses?: no       Is patient compliant with meds? yes       Allergies: 1)  ! Neomycin 2)  ! Tylox 3)  ! Levaquin 4)  ! * Statins  Anticoagulation Management History:      The patient is taking warfarin and comes in today for a routine follow up visit.  Positive risk factors for bleeding include an age of 75 years or older and history of CVA/TIA.  The bleeding index is 'intermediate risk'.  Positive CHADS2 values include Age > 63 years old and Prior Stroke/CVA/TIA.  The start date was 03/23/1998.  Her last INR was 4.4 ratio.  Anticoagulation responsible provider: Tenny Craw MD, Gunnar Fusi.  INR POC: 3.4.  Cuvette Lot#: [id number].  Exp: 02/2011.    Anticoagulation Management Assessment/Plan:      The patient's current anticoagulation dose is Coumadin 4 mg tabs: Use as directed per anticoagulation clinic.  The target INR is 3.5 - 4.  The next INR is due 01/22/2010.  Anticoagulation instructions were given to patient.  Results were reviewed/authorized by Cloyde Reams, RN, BSN.  She was notified by Cloyde Reams RN.         Prior Anticoagulation Instructions: INR 3.9 Continue 4mg s daily except 2mg s on Tuesdays. Recheck in 3 weeks.   Current Anticoagulation Instructions: INR 3.4  Continue on same dosage 4mg  daily except 2mg  on Tuesdays.  Recheck in 3 weeks.

## 2010-12-21 NOTE — Medication Information (Signed)
Summary: rov/tm  Anticoagulant Therapy  Managed by: Cloyde Reams, RN, BSN Referring MD: Tonny Bollman PCP: Illene Regulus, M.D. Supervising MD: Riley Kill MD, Maisie Fus Indication 1: Mitral Valve Disorder (ICD-424.0) Indication 2: Cerebral Artery Embolism (ICD-434.1) Lab Used: [facility] Site: [facility] INR POC 4.5 INR RANGE 3.5 - 4  Dietary changes: no    Health status changes: no    Bleeding/hemorrhagic complications: no    Recent/future hospitalizations: no    Any changes in medication regimen? yes       Details: pt states she is off her potassium pill and eating more bananas and orange juice  Recent/future dental: no     Details: pt thinks she has dental appt in Sept or Oct  Any missed doses?: no       Is patient compliant with meds? yes       Allergies: 1)  ! Neomycin 2)  ! Tylox 3)  ! Levaquin 4)  ! * Statins  Anticoagulation Management History:      The patient is taking warfarin and comes in today for a routine follow up visit.  Positive risk factors for bleeding include an age of 6 years or older and history of CVA/TIA.  The bleeding index is 'intermediate risk'.  Positive CHADS2 values include History of CHF, Age > 33 years old, and Prior Stroke/CVA/TIA.  The start date was 03/23/1998.  Her last INR was 7.8 ratio.  Anticoagulation responsible provider: Riley Kill MD, Maisie Fus.  INR POC: 4.5.  Cuvette Lot#: [id number].  Exp: 08/2011.    Anticoagulation Management Assessment/Plan:      The patient's current anticoagulation dose is Coumadin 4 mg tabs: Use as directed per anticoagulation clinic.  The target INR is 3.5 - 4.  The next INR is due 07/23/2010.  Anticoagulation instructions were given to patient.  Results were reviewed/authorized by Cloyde Reams, RN, BSN.  She was notified by Cloyde Reams RN.         Prior Anticoagulation Instructions: INR 5.0  Skip 1 dose, then continue with 1 tablet daily except for 1/2 tablet on Fridays.  Return to clinic in 2  weeks.  Current Anticoagulation Instructions: INR 4.5 Skip dose today. Decrease dose to 1 tablet every day except for 1/2 tablet on Tuesday and Friday. Recheck in 2 weeks

## 2010-12-21 NOTE — Progress Notes (Signed)
Summary: SOB  Phone Note Call from Patient Call back at [facility] Phone [phone]   Caller: Patient Summary of Call: SOB Initial call taken by: Judie Grieve,  December 14, 2009 10:15 AM  Follow-up for Phone Call        I spoke with the pt and she was lying down in bed on Saturday night (awake) and suddenly became SOB. The episode lasted about 10 minutes and sitting up in bed improved her symptoms.  The pt states she has had no further problems since that episode.  The pt denies swelling or weight gain.  I will make Dr Excell Seltzer aware of this episode. Follow-up by: Julieta Gutting, RN, BSN,  December 14, 2009 10:24 AM  Additional Follow-up for Phone Call Additional follow up Details #1::        would observe and call back if further problems Additional Follow-up by: Norva Karvonen, MD,  December 14, 2009 10:42 AM    Additional Follow-up for Phone Call Additional follow up Details #2::    Pt aware and agrees with plan. Follow-up by: Julieta Gutting, RN, BSN,  December 14, 2009 10:50 AM

## 2010-12-21 NOTE — Assessment & Plan Note (Signed)
Summary: DISCUSS GOUT/ FLU SHOT /NWS   Vital Signs:  Patient profile:   77 year old female Height:      63 inches Weight:      107 pounds BMI:     19.02 O2 Sat:      94 % on Room air Temp:     97.6 degrees F oral Pulse rate:   78 / minute BP sitting:   100 / 80  (left arm)  Vitals Entered By: Alysia Penna (July 27, 2010 2:04 PM)  O2 Flow:  Room air CC: OV to discuss gout and get a flu shot/ cp sma   Primary Care [facility]:  Illene Regulus, M.D.  CC:  OV to discuss gout and get a flu shot/ cp sma.  History of Present Illness: Doris [name] has had 3 episodes in 3 years of what was diagnosed as gout. She did get a good response to colchicine with the first attack but had significant diarrhea. Her other two attacks were treated with prednisone with good results 3rd attack, poor results 2nd attack. She has never had a crystal diagnosis made by aspiration. Her uric acid level was checked in the past and was normal. She has been following a diet that she feels willreduce her risk of gout but has some questions about the foods that she needs to avoid, e.g. sugar, fat. She is also inquiring as to prophylaxis medication.   Current Medications (verified): 1)  Aspirin Adult Low Strength 81 Mg Tbec (Aspirin) .... Take 1 Tablet By Mouth Once A Day 2)  Lasix 40 Mg Tabs (Furosemide) .... Take One Tablet By Mouth One Time Per  Day 3)  Multivitamins  Tabs (Multiple Vitamin) .... Take 1 Tablet By Mouth Once A Day 4)  Coumadin 4 Mg Tabs (Warfarin Sodium) .... Use As Directed Per Anticoagulation Clinic 5)  Alprazolam 0.25 Mg Tabs (Alprazolam) .... Take One Tab By Mouth At Bedtime As Needed 6)  Verapamil Hcl Cr 180 Mg Cr-Tabs (Verapamil Hcl) .... Take 1 1/2 Tablet Daily 7)  Biotears Eye Drops .... As Needed For Dry Eyes  Allergies (verified): 1)  ! Neomycin 2)  ! Tylox 3)  ! Levaquin 4)  ! 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 Tonny Bollman                 Neurosurgery - Dr. Newell Coral                 GI - Dr. [name]                 GS - Dr. Johna Sheriff  Past Surgical History: Last updated: 10/07/2009 D&C '50s Anal fistula repair '50's :  1981 Bjork-Shiley valve Nail avulsion '90 Brainstem surgery for Trigeminal Neuralgia '00 Cholycystectomy '03 Esophageal Dilatation PSH reviewed for relevance, FH reviewed for relevance  Review of  Systems  The patient denies anorexia, fever, weight loss, weight gain, chest pain, syncope, dyspnea on exertion, headaches, abdominal pain, muscle weakness, difficulty walking, abnormal bleeding, and enlarged lymph nodes.    Physical Exam  General:  slender white female looking younger than her stated age in no distress Head:  Normocephalic and atraumatic without obvious abnormalities. No apparent alopecia or balding. Eyes:  vision grossly intact, pupils equal, and pupils round.   Lungs:  normal respiratory effort.   Heart:  rate controlled  Msk:  normal ROM, no joint tenderness, no joint swelling, no joint warmth, and no redness over joints.   Pulses:  2+ radial pulses Neurologic:  alert & oriented X3, cranial nerves II-XII intact, and gait normal.   Skin:  turgor normal and color normal.   Psych:  Oriented X3, normally interactive, and good eye  contact.     Impression & Recommendations:  Problem # 1:  Hx of GOUT (ICD-274.9) Reviewed records and labs. Examined patient. Discussed with her the mechanism of disease and the indications for prophylaxis.  Plan - no prophylaxis at this time. Will reconsider if she has greater than two attacks in 12 months          at next attack she will make an urgent appointment and aspiration of joint fluid will be attempted.                   Provided Care Notes: Low Purine Diet and Patinet information on Gout.   Complete Medication List: 1)  Aspirin Adult Low Strength 81 Mg Tbec (Aspirin) .... Take 1 tablet by mouth once a day 2)  Lasix 40 Mg Tabs (Furosemide) .... Take one tablet by mouth one time per  day 3)  Multivitamins Tabs (Multiple vitamin) .... Take 1 tablet by mouth once a day 4)  Coumadin 4 Mg Tabs (Warfarin sodium) .... Use as directed per anticoagulation clinic 5)  Alprazolam 0.25 Mg Tabs (Alprazolam) .... Take one tab by mouth at bedtime as needed 6)  Verapamil Hcl Cr 180 Mg Cr-tabs (Verapamil hcl) .... Take 1 1/2 tablet daily 7)  Biotears Eye Drops  .... As needed for dry eyes

## 2010-12-21 NOTE — Medication Information (Signed)
Summary: ccr/jss  Anticoagulant Therapy  Managed by: Bethena Midget, RN, BSN Referring MD: Tonny Bollman PCP: Illene Regulus, M.D. Supervising MD: Eden Emms MD, Theron Arista Indication 1: Mitral Valve Disorder (ICD-424.0) Indication 2: Cerebral Artery Embolism (ICD-434.1) Lab Used: [facility] Site: [facility] INR POC 4.1 INR RANGE 3.5 - 4  Dietary changes: no    Health status changes: no    Bleeding/hemorrhagic complications: no    Recent/future hospitalizations: no    Any changes in medication regimen? no    Recent/future dental: no  Any missed doses?: no       Is patient compliant with meds? yes       Allergies: 1)  ! Neomycin 2)  ! Tylox 3)  ! Levaquin 4)  ! * Statins  Anticoagulation Management History:      The patient is taking warfarin and comes in today for a routine follow up visit.  Positive risk factors for bleeding include an age of 75 years or older and history of CVA/TIA.  The bleeding index is 'intermediate risk'.  Positive CHADS2 values include Age > 51 years old and Prior Stroke/CVA/TIA.  The start date was 03/23/1998.  Her last INR was 4.4 ratio.  Anticoagulation responsible provider: Eden Emms MD, Theron Arista.  INR POC: 4.1.  Cuvette Lot#: [id number].  Exp: 03/2011.    Anticoagulation Management Assessment/Plan:      The patient's current anticoagulation dose is Coumadin 4 mg tabs: Use as directed per anticoagulation clinic.  The target INR is 3.5 - 4.  The next INR is due 03/18/2010.  Anticoagulation instructions were given to patient.  Results were reviewed/authorized by Bethena Midget, RN, BSN.  She was notified by Bethena Midget, RN, BSN.         Prior Anticoagulation Instructions: INR 3.8  Continue on same dosage 1 tablet daily except 1/2 tablet on Tuesdays.  Recheck in 3 weeks.    Current Anticoagulation Instructions: INR 4.1 Friday take 2mg s then continue 4mg s daily except 2mg s on Tuesdays. Recheck in 3 weeks. Will incorporate alittle more leafy green veggies in  diet.

## 2010-12-21 NOTE — Assessment & Plan Note (Signed)
Summary: sob   Visit Type:  Follow-up Primary Provider:  Illene Regulus, M.D.  CC:  sob...denies any edema or cp.  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 She is followed by Dr. Excell Seltzer. She was followed in the past by Dr. Corinda Gubler. On the morning of Tuesday 02/02/10, she woke up at 4 am with dyspnea that lasted for 30 minutes. There was no associated awareness of chest pain or palpitations. No recurrence of the dyspnea over the last three days. She was seen in the coumadin clinic today and her INR was 3.8. She does note overall fatigue that is different from her baseline. She has had no fever, chills, near syncope or syncope.   Current Medications (verified): 1)  Aspirin Adult Low Strength 81 Mg Tbec (Aspirin) 2)  Lasix 40 Mg Tabs (Furosemide) .... Take One Tablet By Mouth Every Other Day 3)  Multivitamins  Tabs (Multiple Vitamin) .... Take 1 Tablet By Mouth Once A Day 4)  Coumadin 4 Mg Tabs (Warfarin Sodium) .... Use As Directed Per Anticoagulation Clinic 5)  Alprazolam 0.25 Mg Tabs (Alprazolam) .... Take One Tab By Mouth At Bedtime 6)  Verapamil Hcl Cr 180 Mg Cr-Tabs (Verapamil Hcl) .... Take 1 1/2 Tablet Daily 7)  Biotears Eye Drops .... As Needed For Dry Eyes  Allergies: 1)  ! Neomycin 2)  ! Tylox 3)  ! Levaquin 4)  ! * Statins  Past History:  Past Medical History: Reviewed history from 09/17/2009 and no changes required. Hx of BACTERIAL PNEUMONIA (ICD-482.9) Hx of HERPES ZOSTER WITHOUT MENTION OF COMPLICATION (ICD-053.9) Hx of GOUT (ICD-274.9) Hx of CVA (ICD-434.91) Hx of ACUTE GASTRITIS WITHOUT MENTION OF HEMORRHAGE (ICD-535.00) Hx of NEURALGIA, TRIGEMINAL (ICD-350.1) STRICTURE AND STENOSIS OF ESOPHAGUS (ICD-530.3) GERD  (ICD-530.81) Hx of MITRAL VALVE DISORDERS (ICD-424.0) Hx of PERITONITIS (ICD-567.9) RHEUMATIC FEVER WITH HEART INVOLVEMENT (ICD-391.9) TIA (ICD-435.9) ATRIAL FIBRILLATION (ICD-427.31) MITRAL VALVE REPLACEMENT, HX OF (ICD-V15.1)    Physician Roster:                 cardiology - Dr. Tonny Bollman                 Neurosurgery - Dr. Newell Coral                 GI - Dr. [name]                 GS - Dr. Johna Sheriff  Past Surgical History: Reviewed history from 10/07/2009 and no changes required. D&C '77s Anal fistula repair '50's :  1981 Bjork-Shiley valve Nail avulsion '90 Brainstem surgery for Trigeminal Neuralgia '00 Cholycystectomy '03 Esophageal Dilatation  Family History: Reviewed history from 08/07/2009 and no changes required. father - deceased @ 51:  mother- deceased @100 ; Neg- breast or colon cancer; no DM, no CAD/MI  Social History: Reviewed history from 08/07/2009 and no changes required. 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 Non-smoker  Non-drinker Lives alone End-of-Life: no mechanical ventilation, no CPR.   Review of Systems       The patient complains of shortness of breath and fatigue.  The patient denies malaise, fever, weight gain/loss, vision loss, decreased hearing, hoarseness,  chest pain, palpitations, prolonged cough, wheezing, sleep apnea, coughing up blood, abdominal pain, blood in stool, nausea, vomiting, diarrhea, heartburn, incontinence, blood in urine, muscle weakness, joint pain, leg swelling, rash, skin lesions, headache, fainting, dizziness, depression, anxiety, enlarged lymph nodes, easy bruising or bleeding, and environmental allergies.    Vital Signs:  Patient profile:   77 year old female Height:      63 inches Weight:      112 pounds O2 Sat:      94 % on Room air Pulse rate:   71 / minute Pulse rhythm:    irregular BP sitting:   112 / 60  (left arm) Cuff size:   regular  Vitals Entered By: Danielle Rankin, CMA (February 05, 2010 9:56 AM)  O2 Flow:  Room air  Physical Exam  General:  General: Well developed, well nourished, NAD HEENT: OP clear, mucus membranes moist SKIN: warm, dry Neuro: No focal deficits Musculoskeletal: Muscle strength 5/5 all ext Psychiatric: Mood and affect normal Neck: No JVD, no carotid bruits, no thyromegaly, no lymphadenopathy. Lungs:Clear bilaterally, no wheezes, rhonci, crackles CV: Irregular with crisp valve click. No murmurs, gallops or rubs Abdomen: soft, NT, ND, BS present Extremities: No edema, pulses 2+.    EKG  Procedure date:  02/05/2010  Findings:      Atrial fibrillation, rate 71 bpm. Possible septal infarct.   Echocardiogram  Procedure date:  10/22/2009  Findings:      Study Conclusions            - Left ventricle: The cavity size was normal. Wall thickness was       normal. Septal bounce consistent with prior cardiac surgery.       Systolic function was normal. The estimated ejection fraction was       55%. Wall motion was normal; there were no regional wall motion       abnormalities.     - Mitral valve: Bjork-Shiley mechanical mitral valve prosthesis. The       mean gradient across thevalve is mildly elevated (6 mmHg, normal <       4-5 mmHg) but no change from prior echo. Short pressure half time       confirms no significant stenosis. No mitral regurgitation noted       but shadowing from the valve makes this difficult to assess. Mean       gradient: 6mm Hg (D). Valve area by continuity equation (using       LVOT flow): 2.48cm 2.     - Left atrium: The atrium was moderately to severely dilated.     - Right ventricle: The cavity size was mildly dilated. Systolic       function was normal.     - Right atrium: The atrium was moderately to severely dilated.     - Tricuspid valve: PA systolic pressure 34-38 mmHg.     - Systemic  veins: IVC measures 2.2 cm with normal respirophasic       variation, suggesting RA pressure 6-10 mmHg.     - Impressions: The patient was in atrial fibrillation during this       study.     Impressions:            - Normal LV size and systolic function, EF 55%. No regional wall       motion abnormalities. There isa Bjork-Shiley mechanical mitral       prosthesis with no significant stenosis. No mitral regurgitation  noted but shadowing from the valve makes this difficult to assess.       Mild pulmonary hypertension. moderate to severe biatrial       enlargement. The patient was in atrial fibrillation during this       study.  Impression & Recommendations:  Problem # 1:  Hx of MITRAL VALVE DISORDERS (ICD-424.0) Examination of valve is normal. Will repeat echo to assess. Continue coumadin.   Her updated medication list for this problem includes:    Lasix 40 Mg Tabs (Furosemide) .Marland Kitchen... Take one tablet by mouth every other day  Problem # 2:  ATRIAL FIBRILLATION (ICD-427.31) INR has been therapeutic. Her dyspnea may be related to rapid ventricular response but she checked her pulse and it was 55 after event. No awareness of palpitations. Continue verapamil for rate control and coumadin. I do not think that she needs an event monitor at this time.   Her updated medication list for this problem includes:    Aspirin Adult Low Strength 81 Mg Tbec (Aspirin)    Coumadin 4 Mg Tabs (Warfarin sodium) ..... Use as directed per anticoagulation clinic  Other Orders: EKG w/ Interpretation (93000) Echocardiogram (Echo)  Patient Instructions: 1)  Your physician recommends that you schedule a follow-up appointment in: Keep scheduled appt with Dr. Excell Seltzer on Mar 30, 2010 at 12:15 2)  Your physician recommends that you continue on your current medications as directed. Please refer to the Current Medication list given to you today. 3)  Your physician has requested that you have an echocardiogram.   Echocardiography is a painless test that uses sound waves to create images of your heart. It provides your doctor with information about the size and shape of your heart and how well your heart's chambers and valves are working.  This procedure takes approximately one hour. There are no restrictions for this procedure.

## 2010-12-21 NOTE — Progress Notes (Signed)
Summary: SOB  Phone Note Call from Patient Call back at Home Phone [phone]   Caller: Patient Summary of Call: PT HAVING SOB Initial call taken by: Judie Grieve,  February 02, 2010 8:21 AM  Follow-up for Phone Call        per pt called lasting for 1/2 .Marland Kitchen [phone] Lorne Skeens  February 02, 2010 10:53 AM   I spoke with the pt and she was awoken this morning at 4:18 with SOB.  This episode lasted until 4:58.  The pt denied CP with this episode but did have discomfort in her left hand and some palpitations.  The pt's heart rate was 50 and she noticed that it was irregular.  The pt put extra pillows under her head and this helped her symptoms. The pt has felt okay since this episode.  I will make Dr Excell Seltzer aware of this episode and follow-up with the pt.  Follow-up by: Julieta Gutting, RN, BSN,  February 02, 2010 12:37 PM  Additional Follow-up for Phone Call Additional follow up Details #1::        I discussed this pt with Dr Excell Seltzer and her symptoms have resolved.  At this time Dr Excell Seltzer would like the pt to call is she has increasing frequency or duration in these episodes.  Pt agrees with plan.   Additional Follow-up by: Julieta Gutting, RN, BSN,  February 02, 2010 2:33 PM

## 2010-12-21 NOTE — Assessment & Plan Note (Signed)
Summary: 2 month rov   Visit Type:  Follow-up Primary Provider:  Illene Regulus, M.D.  CC:  irregular heart beat.  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 She notes exertional dyspnea over the past year, but has stabilized with diuretic therapy.  Follow-up echo shows a normal functioning mitral prosthesis, normal LV function, normal estimate of pulmonary pressures, normal RV function, and severe biatrial enlargement.  Overall she is doing well at present.  She has been most symptomatic at night, but this is better with use of a small wedge to elevate her head. Exertional dyspnea is unchanged...able to do household work but walking outdoors is limited. No edema.   Current Medications (verified): 1)  Aspirin Adult Low Strength 81 Mg Tbec (Aspirin) .... Take 1 Tablet By Mouth Once A Day 2)  Lasix 40 Mg Tabs (Furosemide) .... Take One Tablet By Mouth One Time Per  Day 3)  Multivitamins  Tabs (Multiple Vitamin) .... Take 1 Tablet By Mouth Once A Day 4)  Coumadin 4 Mg Tabs (Warfarin Sodium) .... Use As Directed Per Anticoagulation Clinic 5)  Alprazolam 0.25 Mg Tabs (Alprazolam) .... Take One Tab By Mouth At Bedtime As Needed 6)  Verapamil Hcl Cr 180 Mg Cr-Tabs (Verapamil Hcl) .... Take 1 1/2 Tablet Daily 7)  Biotears Eye Drops .... As Needed For Dry Eyes  Allergies: 1)  ! Neomycin 2)  ! Tylox 3)  ! Levaquin 4)  ! * Statins  Past History:  Past medical history reviewed for relevance to current acute and chronic problems.  Past Medical History: Hx of BACTERIAL PNEUMONIA (ICD-482.9) Hx of HERPES ZOSTER WITHOUT MENTION OF COMPLICATION (ICD-053.9) Hx of GOUT (ICD-274.9) Hx of CVA (ICD-434.91) Hx of ACUTE GASTRITIS WITHOUT MENTION OF HEMORRHAGE (ICD-535.00) Hx of NEURALGIA, TRIGEMINAL (ICD-350.1) STRICTURE  AND STENOSIS OF ESOPHAGUS (ICD-530.3) GERD (ICD-530.81) Hx of MITRAL VALVE DISORDERS (ICD-424.0) Hx of PERITONITIS (ICD-567.9) RHEUMATIC FEVER WITH HEART INVOLVEMENT (ICD-391.9) TIA (ICD-435.9) ATRIAL FIBRILLATION (ICD-427.31) MITRAL VALVE REPLACEMENT, HX OF (ICD-V15.1) - Bjork Shiley valve 1981 Chronic diastolic heart failure    Physician Roster:                 cardiology - Dr. Tonny Bollman                 Neurosurgery - Dr. Newell Coral                 GI - Dr. [name]                 GS - Dr. Johna Sheriff  Review of Systems       Positive for gait instability, memory impairment. Otherwise negative except as per HPI.  Vital Signs:  Patient profile:   77 year old female Height:      63 inches Weight:      107 pounds BMI:     19.02 Pulse rate:   71 / minute Pulse rhythm:   irregular Resp:     18 per minute BP sitting:   118 / 66  (left arm) Cuff size:   small  Vitals Entered By: Vikki Ports (June 14, 2010 10:09 AM)  Physical Exam  General:  Pt is alert and oriented, pleasant elderly woman, in no acute distress. HEENT: normal Neck: normal carotid upstrokes without bruits, JVP normal Lungs: CTA CV: irregularly irregular, dynamic apical impulse  Abd: soft, NT, positive BS, no bruit, no organomegaly Ext: no clubbing, cyanosis, or edema. peripheral pulses 2+ and equal Skin: warm and dry without rash    EKG  Procedure date:  06/14/2010  Findings:      Atrial fibrillation, ST - T wave abnormality likely dig effect, HR 71 bpm  Impression & Recommendations:  Problem # 1:  ATRIAL FIBRILLATION (ICD-427.31) Continue coumadin for anticoagulation, heart rate remains well-controlled. Pt with no bleeding problems.  Her updated medication list for this problem includes:    Aspirin Adult Low Strength 81 Mg Tbec (Aspirin) .Marland Kitchen... Take 1 tablet by mouth once a day    Coumadin 4 Mg Tabs (Warfarin sodium) ..... Use as directed per anticoagulation clinic  Orders: EKG w/  Interpretation (93000)  Problem # 2:  Hx of MITRAL VALVE DISORDERS (ICD-424.0) Print production planner heart sounds - echo reviewed and shows stable and normally-functioning mitral prosthesis. Her updated medication list for this problem includes:    Lasix 40 Mg Tabs (Furosemide) .Marland Kitchen... Take one tablet by mouth one time per  day  Problem # 3:  DIASTOLIC HEART FAILURE, CHRONIC (ICD-428.32) Appears stable and euvolemic on exam. Continue furosemide once daily. Potassium discontinued and will repeat a BMET next week. Reviewed high potassium foods with patient. Plan f/u in 3 months.  Her updated medication list for this problem includes:    Aspirin Adult Low Strength 81 Mg Tbec (Aspirin) .Marland Kitchen... Take 1 tablet by mouth once a day    Lasix 40 Mg Tabs (Furosemide) .Marland Kitchen... Take one tablet by mouth one time per  day    Coumadin 4 Mg Tabs (Warfarin sodium) ..... Use as directed per anticoagulation clinic    Verapamil Hcl Cr 180 Mg Cr-tabs (Verapamil hcl) .Marland Kitchen... Take 1 1/2 tablet daily  Patient Instructions: 1)  Your physician recommends that you schedule a follow-up appointment in: 3 months. 2)  Your physician recommends that you return for lab work next wednsday 06/23/10: bmet (428.32;427.31) 3)  Your physician recommends that you continue on your current medications as directed. Please refer to the Current Medication list given to you today.

## 2010-12-21 NOTE — Progress Notes (Signed)
Summary: Calling regarding medication Potassium  Phone Note Call from Patient Call back at Home Phone [phone]   Caller: Patient Summary of Call: Pt calling regarding medication Potassium Initial call taken by: Judie Grieve,  June 08, 2010 1:01 PM  Follow-up for Phone Call        Spoke with pt. Patient states she has been taken Potassium 20 meq pills once a day. Since she started taken this medication. She has been having stomache pain. It does not make any differnce if she takes the  medication in the morning or in the evening. Pt. states she stoped taken the medication yesterday and the pain went away. She would like to let Dr. Excell Seltzer know. Also she is taken only 40 mg of Lasix daily instead of twice a day. Pt. states " I  knows what I am doing". Pt. has an appointment with Dr. Excell Seltzer on Monday 06/14/10. She would like to discuss other medication for her to take instead of potassium.  Follow-up by: Ollen Gross, RN, BSN,  June 08, 2010 1:44 PM  Additional Follow-up for Phone Call Additional follow up Details #1::        can try discontinuing KCl and increasing dietary potassium. If she does this, should have BMET drawn within a few wks. Additional Follow-up by: Norva Karvonen, MD,  June 09, 2010 11:12 PM    Additional Follow-up for Phone Call Additional follow up Details #2::    Called patient back...she will increase the Potassium in her diet. Has appointment with [facility] on 7/25 ....advised her that he might order a blood test that day to check her K level. Follow-up by: J REISS RN  New/Updated Medications: LASIX 40 MG TABS (FUROSEMIDE) Take one tablet by mouth one time per  day

## 2010-12-21 NOTE — Medication Information (Signed)
Summary: rov/sl  Anticoagulant Therapy  Managed by: Bethena Midget, RN, BSN Referring MD: Tonny Bollman PCP: Illene Regulus, M.D. Supervising MD: Jens Som MD, Arlys John Indication 1: Mitral Valve Disorder (ICD-424.0) Indication 2: Cerebral Artery Embolism (ICD-434.1) Lab Used: [facility] Site: [facility] INR POC 4.7 INR RANGE 3.5 - 4  Dietary changes: no    Health status changes: no    Bleeding/hemorrhagic complications: no    Recent/future hospitalizations: no    Any changes in medication regimen? no    Recent/future dental: no  Any missed doses?: no       Is patient compliant with meds? yes       Allergies: 1)  ! Neomycin 2)  ! Tylox 3)  ! Levaquin 4)  ! * Statins  Anticoagulation Management History:      The patient is taking warfarin and comes in today for a routine follow up visit.  Positive risk factors for bleeding include an age of 75 years or older and history of CVA/TIA.  The bleeding index is 'intermediate risk'.  Positive CHADS2 values include History of CHF, Age > 75 years old, and Prior Stroke/CVA/TIA.  The start date was 03/23/1998.  Her last INR was 7.8 ratio.  Anticoagulation responsible provider: Jens Som MD, Arlys John.  INR POC: 4.7.  Cuvette Lot#: [id number].  Exp: 10/2011.    Anticoagulation Management Assessment/Plan:      The patient's current anticoagulation dose is Coumadin 4 mg tabs: Use as directed per anticoagulation clinic.  The target INR is 3.5 - 4.  The next INR is due 10/19/2010.  Anticoagulation instructions were given to patient.  Results were reviewed/authorized by Bethena Midget, RN, BSN.  She was notified by Bethena Midget, RN, BSN.         Prior Anticoagulation Instructions: INR 3.3  Tomorrow, Saturday, October 29th, take Coumadin 1.5 tab (4 mg). Then, take Coumadin 1 tab (4 mg) on all days except Coumadin 0.5 tab (2 mg) on Fridays. Return to clinic in 2 weeks.   Current Anticoagulation Instructions: INR 4.7 Skip today's dose then change  dose to 1  tablet daily except 1/2 tablet on Mondays and Fridays. Recheck in 2 weeks.

## 2010-12-21 NOTE — Medication Information (Signed)
Summary: rov/ln  Anticoagulant Therapy  Managed by: Bethena Midget, RN, BSN Referring MD: Tonny Bollman PCP: Illene Regulus, M.D. Supervising MD: Riley Kill MD, Maisie Fus Indication 1: Mitral Valve Disorder (ICD-424.0) Indication 2: Cerebral Artery Embolism (ICD-434.1) Lab Used: [facility]  Site: [facility] INR POC 3.2 INR RANGE 3.5 - 4  Dietary changes: no    Health status changes: no    Bleeding/hemorrhagic complications: no    Recent/future hospitalizations: no    Any changes in medication regimen? yes       Details: Discontinued Potassium few days ago  Recent/future dental: no  Any missed doses?: no       Is patient compliant with meds? yes      Comments: Saw Dr Excell Seltzer today.   Allergies: 1)  ! Neomycin 2)  ! Tylox 3)  ! Levaquin 4)  ! * Statins  Anticoagulation Management History:      The patient is taking warfarin and comes in today for a routine follow up visit.  Positive risk factors for bleeding include an age of 75 years or older and history of CVA/TIA.  The bleeding index is 'intermediate risk'.  Positive CHADS2 values include Age > 88 years old and Prior Stroke/CVA/TIA.  The start date was 03/23/1998.  Her last INR was 7.8 ratio.  Anticoagulation responsible provider: Riley Kill MD, Maisie Fus.  INR POC: 3.2.  Cuvette Lot#: [id number].  Exp: 08/2011.    Anticoagulation Management Assessment/Plan:      The patient's current anticoagulation dose is Coumadin 4 mg tabs: Use as directed per anticoagulation clinic.  The target INR is 3.5 - 4.  The next INR is due 06/28/2010.  Anticoagulation instructions were given to patient.  Results were reviewed/authorized by Bethena Midget, RN, BSN.  She was notified by Bethena Midget, RN, BSN.         Prior Anticoagulation Instructions: INR 5.3  Hold coumadin today and take 1/2 tab on Friday.  Then resume normal regimen of 1 tab daily except 1/2 tab on Tuesday and Friday.  Re-check INR in 2 weeks.  Current Anticoagulation Instructions: INR  3.2 Change dose to 1 tablet everyday except 1/2 tablet on Fridays. Recheck in 2 weeks.

## 2010-12-21 NOTE — Medication Information (Signed)
Summary: rov/mw  Anticoagulant Therapy  Managed by: Cloyde Reams, RN, BSN Referring MD: Tonny Bollman PCP: Illene Regulus, M.D. Supervising MD: Daleen Squibb MD,Thomas Indication 1: Mitral Valve Disorder (ICD-424.0) Indication 2: Cerebral Artery Embolism (ICD-434.1) Lab Used: [facility] Site: [facility] INR POC 3.8 INR RANGE 3.5 - 4  Dietary changes: no    Health status changes: no    Bleeding/hemorrhagic complications: no    Recent/future hospitalizations: no    Any changes in medication regimen? no    Recent/future dental: no  Any missed doses?: no       Is patient compliant with meds? yes       Allergies: 1)  ! Neomycin 2)  ! Tylox 3)  ! Levaquin 4)  ! * Statins  Anticoagulation Management History:      The patient is taking warfarin and comes in today for a routine follow up visit.  Positive risk factors for bleeding include an age of 75 years or older and history of CVA/TIA.  The bleeding index is 'intermediate risk'.  Positive CHADS2 values include History of CHF, Age > 75 years old, and Prior Stroke/CVA/TIA.  The start date was 03/23/1998.  Her last INR was 7.8 ratio.  Anticoagulation responsible provider: Wall MD,Thomas.  INR POC: 3.8.  Cuvette Lot#: [id number].  Exp: 09/2011.    Anticoagulation Management Assessment/Plan:      The patient's current anticoagulation dose is Coumadin 4 mg tabs: Use as directed per anticoagulation clinic.  The target INR is 3.5 - 4.  The next INR is due 09/03/2010.  Anticoagulation instructions were given to patient.  Results were reviewed/authorized by Cloyde Reams, RN, BSN.  She was notified by Kennieth Francois.         Prior Anticoagulation Instructions: INR 4  Continue current dosing regimen of 1 tablet on sunday, monday, wednesday, thursday, and saturday. Take 1/2 on tuesday and friday.   Current Anticoagulation Instructions: INR 3.8  Continue taking one tablet every day except for one-half tablet on Tuesday and Friday.  We will see  you in three weeks.

## 2010-12-21 NOTE — Medication Information (Signed)
Summary: rov/sp  Anticoagulant Therapy  Managed by: Bethena Midget, RN, BSN Referring MD: Tonny Bollman PCP: Illene Regulus, M.D. Supervising MD: Ladona Ridgel MD, Sharlot Gowda Indication 1: Mitral Valve Disorder (ICD-424.0) Indication 2: Cerebral Artery Embolism (ICD-434.1) Lab Used: [facility] Site: [facility] INR POC 3.8 INR RANGE 3.5 - 4  Dietary changes: no    Health status changes: no    Bleeding/hemorrhagic complications: no    Recent/future hospitalizations: no    Any changes in medication regimen? no    Recent/future dental: no  Any missed doses?: no       Is patient compliant with meds? yes       Allergies: 1)  ! Neomycin 2)  ! Tylox 3)  ! Levaquin 4)  ! * Statins  Anticoagulation Management History:      The patient is taking warfarin and comes in today for a routine follow up visit.  Positive risk factors for bleeding include an age of 96 years or older and history of CVA/TIA.  The bleeding index is 'intermediate risk'.  Positive CHADS2 values include Age > 56 years old and Prior Stroke/CVA/TIA.  The start date was 03/23/1998.  Her last INR was 3.0 ratio.  Anticoagulation responsible provider: Ladona Ridgel MD, Sharlot Gowda.  INR POC: 3.8.  Cuvette Lot#: [id number].  Exp: 04/2011.    Anticoagulation Management Assessment/Plan:      The patient's current anticoagulation dose is Coumadin 4 mg tabs: Use as directed per anticoagulation clinic.  The target INR is 3.5 - 4.  The next INR is due 04/29/2010.  Anticoagulation instructions were given to patient.  Results were reviewed/authorized by Bethena Midget, RN, BSN.  She was notified by Bethena Midget, RN, BSN.         Prior Anticoagulation Instructions: INR 3.6  Continue same dose of 1 tablet every day except 1/2 tablet on Tuesday   Current Anticoagulation Instructions: INR 3.8 Continue 4mg  everyday except 2mg s on Tuesdays. Recheck in 3 weeks.

## 2010-12-21 NOTE — Medication Information (Signed)
Summary: rov/sp  Anticoagulant Therapy  Managed by: Cloyde Reams, RN, BSN Referring MD: Tonny Bollman PCP: Illene Regulus, M.D. Supervising MD: Daleen Squibb MD,Thomas Indication 1: Mitral Valve Disorder (ICD-424.0) Indication 2: Cerebral Artery Embolism (ICD-434.1) Lab Used: [facility] Site: [facility] INR POC 4 INR RANGE 3.5 - 4  Dietary changes: no    Health status changes: no    Bleeding/hemorrhagic complications: no    Recent/future hospitalizations: no    Any changes in medication regimen? no    Recent/future dental: no  Any missed doses?: no       Is patient compliant with meds? yes       Allergies: 1)  ! Neomycin 2)  ! Tylox 3)  ! Levaquin 4)  ! * Statins  Anticoagulation Management History:      Positive risk factors for bleeding include an age of 75 years or older and history of CVA/TIA.  The bleeding index is 'intermediate risk'.  Positive CHADS2 values include History of CHF, Age > 5 years old, and Prior Stroke/CVA/TIA.  The start date was 03/23/1998.  Her last INR was 7.8 ratio.  Anticoagulation responsible Arrion Broaddus: Wall MD,Thomas.  INR POC: 4.  Cuvette Lot#: [id number].  Exp: 08/2011.    Anticoagulation Management Assessment/Plan:      The patient's current anticoagulation dose is Coumadin 4 mg tabs: Use as directed per anticoagulation clinic.  The target INR is 3.5 - 4.  The next INR is due 08/13/2010.  Anticoagulation instructions were given to patient.  Results were reviewed/authorized by Cloyde Reams, RN, BSN.  She was notified by Lyna Poser PharmD.         Prior Anticoagulation Instructions: INR 4.5 Skip dose today. Decrease dose to 1 tablet every day except for 1/2 tablet on Tuesday and Friday. Recheck in 2 weeks  Current Anticoagulation Instructions: INR 4  Continue current dosing regimen of 1 tablet on sunday, monday, wednesday, thursday, and saturday. Take 1/2 on tuesday and friday.

## 2010-12-23 NOTE — Progress Notes (Signed)
Summary[facility]   Imported By: Sherian Rein 12/09/2010 14:48:11  _____________________________________________________________________  External Attachment:    Type:   Image     Comment:   External Document

## 2010-12-23 NOTE — Progress Notes (Signed)
Summary: speak to nurse  Phone Note Call from Patient Call back at Home Phone [phone]   Caller: Patient Call For: Dr [name] Reason for Call: Talk to Nurse Summary of Call: Patient wants to speak directly to nurse regarding meds. Would like a call today sisnce she's going to go out of town. Initial call taken by: Tawni Levy,  December 14, 2010 9:08 AM  Follow-up for Phone Call         Reminded  that Dr.Cooper said she can try Prilosec when she runs out of aciphex.Pt. can't afford Aciphex. Follow-up by: Teryl Lucy RN,  December 14, 2010 9:47 AM

## 2010-12-23 NOTE — Letter (Signed)
Summary: [facility]  [facility]   Imported By: Sherian Rein 10/26/2010 13:50:27  _____________________________________________________________________  External Attachment:    Type:   Image     Comment:   External Document

## 2010-12-23 NOTE — Medication Information (Addendum)
Summary: rov/tm  Anticoagulant Therapy  Managed by: Windell Hummingbird, RN Referring MD: Tonny Bollman PCP: Illene Regulus, MD Supervising MD: Cassell Clement Indication 1: Mitral Valve Disorder (ICD-424.0) Indication 2: Cerebral Artery Embolism (ICD-434.1) Lab Used: [facility] Site: [facility] INR POC 3.9 INR RANGE 3.5 - 4  Dietary changes: no    Health status changes: no    Bleeding/hemorrhagic complications: no    Recent/future hospitalizations: no    Any changes in medication regimen? yes       Details: Began taking Metamucil and Prilosec 11 days ago  Recent/future dental: no  Any missed doses?: no       Is patient compliant with meds? yes       Allergies: 1)  ! Neomycin 2)  ! Tylox 3)  ! Levaquin 4)  ! * Statins  Anticoagulation Management History:      The patient is taking warfarin and comes in today for a routine follow up visit.  Positive risk factors for bleeding include an age of 75 years or older and history of CVA/TIA.  The bleeding index is 'intermediate risk'.  Positive CHADS2 values include History of CHF, Age > 2 years old, and Prior Stroke/CVA/TIA.  The start date was 03/23/1998.  Her last INR was 7.8 ratio.  Anticoagulation responsible provider: Cassell Clement.  INR POC: 3.9.  Cuvette Lot#: [id number].  Exp: 11/2011.    Anticoagulation Management Assessment/Plan:      The patient's current anticoagulation dose is Coumadin 4 mg tabs: Use as directed per anticoagulation clinic.  The target INR is 3.5 - 4.  The next INR is due 01/05/2011.  Anticoagulation instructions were given to patient.  Results were reviewed/authorized by Windell Hummingbird, RN.  She was notified by Windell Hummingbird, RN.         Prior Anticoagulation Instructions: INR 4.0  Coumadin 4 mg tablets - Continue 1 tablet every day   Current Anticoagulation Instructions: INR 3.9 Continue taking 1 tablet every day. Check in 3 weeks.

## 2010-12-23 NOTE — Progress Notes (Signed)
Summary: can pt take coumadin along with prilosec   Phone Note Call from Patient Call back at Home Phone [phone]   Caller: Patient Summary of Call: can pt take coumadin along with prilosec  pcp rx her this med. Initial call taken by: Lorne Skeens,  December 13, 2010 3:51 PM  Follow-up for Phone Call        Telephoned pt back and verified after discussing with  PharmD that she can take coumadin and Prilosec together.  Follow-up by: Bethena Midget, RN, BSN,  December 13, 2010 4:55 PM

## 2010-12-23 NOTE — Consult Note (Signed)
Summary[facility]   Imported By: Sherian Rein 12/09/2010 14:51:43  _____________________________________________________________________  External Attachment:    Type:   Image     Comment:   External Document

## 2010-12-23 NOTE — Progress Notes (Signed)
Summary[facility]   Imported By: Sherian Rein 12/09/2010 14:49:27  _____________________________________________________________________  External Attachment:    Type:   Image     Comment:   External Document

## 2010-12-23 NOTE — Assessment & Plan Note (Signed)
Summary: GERD, change in bowels   History of Present Illness Visit Type: Initial Visit Primary GI MD: Yancey Flemings MD Primary Provider: Illene Regulus, MD Chief Complaint: Patient c/o constipation, incomplete rectal emptying . She also c/o GERD and bloating. History of Present Illness:   77 year old with multiple medical problems including rheumatic fever with valvular heart disease status post mitral valve replacement, atrial fibrillation, history of CVA, chronic Coumadin therapy, GERD complicated by peptic stricture, and trigeminal neuralgia. She presents today with a chief complaint of change in bowel habits. Also some GERD. First, a 6-12 month history of change in bowel habits as manifested by change in stool quality and frequency. She reports increased frequency of bowel movements up to 4-5 times per day. Generally small and ball-like. No nocturnal symptoms, abdominal pain, or incontinence. She feels that she is unable to completely empty the rectal vault. No bleeding. Last colonoscopy in 2003 revealing diverticulosis and a diminutive polyp. Next, complains of some intermittent GERD symptoms as manifested by regurgitation, mild pyrosis. No dysphagia. Some bloating. Currently not taking PPI.   GI Review of Systems    Reports acid reflux and  bloating.      Denies abdominal pain, belching, chest pain, dysphagia with liquids, dysphagia with solids, heartburn, loss of appetite, nausea, vomiting, vomiting blood, weight loss, and  weight gain.      Reports change in bowel habits and  constipation.     Denies anal fissure, black tarry stools, diarrhea, diverticulosis, fecal incontinence, heme positive stool, hemorrhoids, irritable bowel syndrome, jaundice, light color stool, liver problems, rectal bleeding, and  rectal pain. Preventive Screening-Counseling & Management  Alcohol-Tobacco     Smoking Status: quit    Current Medications (verified): 1)  Aspirin Adult Low Strength 81 Mg Tbec  (Aspirin) .... Take 1 Tablet By Mouth Once A Day 2)  Lasix 40 Mg Tabs (Furosemide) .... Take One Tablet By Mouth One Time Per  Day 3)  Multivitamins  Tabs (Multiple Vitamin) .... Take 1 Tablet By Mouth Once A Day 4)  Coumadin 4 Mg Tabs (Warfarin Sodium) .... Use As Directed Per Anticoagulation Clinic 5)  Alprazolam 0.25 Mg Tabs (Alprazolam) .... Take One Tab By Mouth At Bedtime As Needed 6)  Verapamil Hcl Cr 180 Mg Cr-Tabs (Verapamil Hcl) .... Take 1 1/2 Tablet Daily 7)  Biotears Eye Drops .... As Needed For Dry Eyes 8)  Tums 500 Mg Chew (Calcium Carbonate Antacid) .... Take As Needed  Allergies (verified): 1)  ! Neomycin 2)  ! Tylox 3)  ! Levaquin 4)  ! * Statins  Past History:  Past Medical History: Reviewed history from 11/30/2010 and no changes required. Hx of BACTERIAL PNEUMONIA (ICD-482.9) Hx of HERPES ZOSTER WITHOUT MENTION OF COMPLICATION (ICD-053.9) Hx of GOUT (ICD-274.9) Hx of CVA (ICD-434.91) Hx of ACUTE GASTRITIS WITHOUT MENTION OF HEMORRHAGE (ICD-535.00) Hx of NEURALGIA, TRIGEMINAL (ICD-350.1) STRICTURE AND STENOSIS OF ESOPHAGUS (ICD-530.3) GERD (ICD-530.81) Hx of MITRAL VALVE DISORDERS (ICD-424.0) Hx of PERITONITIS (ICD-567.9) RHEUMATIC FEVER WITH HEART INVOLVEMENT (ICD-391.9) TIA (ICD-435.9) ATRIAL FIBRILLATION (ICD-427.31) MITRAL VALVE REPLACEMENT, HX OF (ICD-V15.1) - Bjork Shiley valve 1981 Chronic diastolic heart failure Hyperplastic Polyps Physician Roster:                 cardiology - Dr. Tonny Bollman                 Neurosurgery - Dr. Newell Coral                 GI - Dr. [name]  GS - Dr. Johna Sheriff Diverticulosis  Past Surgical History: D&C '50s Anal fistula repair '50's :  1981 Bjork-Shiley valve Nail avulsion '90 Brainstem surgery for Trigeminal Neuralgia '00 Cholycystectomy '03   Esophageal Dilatation  Family History: Reviewed history from 08/07/2009 and no changes required. 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 Lives alone End-of-Life: no mechanical ventilation, no CPR.  Patient is a former smoker.  Alcohol Use - yes-1 oz daily Daily Caffeine Use-1 cup daily Smoking Status:  quit  Review of Systems       The patient complains of hearing problems, shortness of breath, sleeping problems, and swelling of feet/legs.  The patient denies allergy/sinus, anemia, anxiety-new, arthritis/joint pain, back pain, blood in urine, breast changes/lumps, change in vision, confusion, cough, coughing up blood, depression-new, fainting, fatigue, fever, headaches-new, heart murmur, heart rhythm changes, itching, menstrual pain, muscle pains/cramps, night sweats, nosebleeds, pregnancy symptoms, skin rash, sore throat, swollen lymph glands, thirst - excessive , urination - excessive , urination changes/pain, urine leakage, vision changes, and voice change.    Vital Signs:  Patient profile:   77 year old female Height:      63 inches Weight:      110 pounds BMI:     19.56 BSA:     1.50 Pulse rate:   72 / minute Pulse rhythm:   irregular BP sitting:   124 / 68  (right arm) Cuff size:   regular  Vitals Entered By: Lamona Curl CMA Duncan Dull) (December 01, 2010 3:28 PM)  Physical Exam  General:  Well developed, well nourished, no acute distress. Head:  Normocephalic and atraumatic. Eyes:  PERRLA, no icterus. Mouth:  No deformity or lesions Neck:  Supple; no masses or thyromegaly. Lungs:  Clear throughout to auscultation. Heart:  irregular rate and rhythm; no murmurs, rubs,  or bruits.Mechanical heart counds Abdomen:  Soft, nontender and nondistended. No masses, hepatosplenomegaly or hernias noted. Normal bowel sounds. Rectal:  decreased tone . otherwise normal w/ heme  negative stool. sensation intact Msk:  Symmetrical with no gross deformities. Normal posture. Pulses:  irregular Extremities:  No clubbing, cyanosis, edema or deformities noted. Neurologic:  Alert and  oriented x4;  grossly normal neurologically. Skin:  Intact without significant lesions or rashes. Psych:  Alert and cooperative. Normal mood and affect.   Impression & Recommendations:  Problem # 1:  OTHER SYMPTOMS INVOLVING DIGESTIVE SYSTEM OTHER (ICD-79.3) 77 year old who presents with nonspecific change in bowel habits as described.  Plan: #1. Metamucil one to 2 tablespoons daily in 12-14 ounces of water.  Problem # 2:  GERD (ICD-530.81) recent GERD symptoms off PPI  Plan: #1. AcipHex 20 mg daily. Samples given. Can use Prilosec OTC when samples completed #2. Reflux precautions  Problem # 3:  STRICTURE AND STENOSIS OF ESOPHAGUS (ICD-530.3) history of esophageal stricture requiring dilation. No recurrent dysphagia. Resume PPI.  Patient Instructions: 1)  Copy sent to : Illene Regulus, MD 2)  Take Aciphex once daily samples given  3)  Take Metamucil as directed once a day  4)  Follow up as needed  5)  The medication list was reviewed and reconciled.  All changed / newly prescribed medications were explained.  A complete medication list was provided to the patient / caregiver.

## 2010-12-23 NOTE — Op Note (Signed)
Summary: operative report    NAME:  Doris [name], Doris [name]                         ACCOUNT NO.:  [id number]   MEDICAL RECORD NO.:  [id number]                   PATIENT TYPE:  INP   LOCATION:  3712                                 FACILITY:  [facility]   PHYSICIAN:  Sharlet Salina T. Hoxworth, M.D.          DATE OF BIRTH:  1924-10-06   DATE OF PROCEDURE:  09/23/2002  DATE OF DISCHARGE:                                 OPERATIVE REPORT   PREOPERATIVE DIAGNOSIS:  Symptomatic cholelithiasis.   POSTOPERATIVE DIAGNOSIS:  Symptomatic cholelithiasis.   PROCEDURE:  Laparoscopic cholecystectomy with intraoperative cholangiogram.   SURGEON:  Sharlet Salina T. Hoxworth, M.D.   ASSISTANTAngelia Mould. Derrell Lolling, M.D.   ANESTHESIA:  General.   BRIEF HISTORY:  The patient is a 77 year old white female with repeated  episodes of midabdominal pain.  She has had an extensive negative workup  except for her gallbladder ultrasound, which shows a single small gallstone.  She was felt to likely have symptomatic cholelithiasis, and laparoscopic  cholecystectomy has been recommended and accepted.  The nature of the  procedure, its indications, the risks of bleeding, infection, bile leak,  bile duct injury, were discussed and understood preoperatively.  The patient  was chronically anticoagulated and has been admitted preoperatively for  heparin therapy.  Her heparin was stopped 12 hours prior to the surgery.  She received preoperative antibiotics.  PAS were in place.  She was brought  to the operating room for this procedure.   DESCRIPTION OF PROCEDURE:  The patient was brought to the operating room and  placed in the supine position on the operating table, and general  endotracheal anesthesia was induced.  An orogastric tube was placed.  The  abdomen was sterilely prepped and draped.  Local anesthesia was used to  infiltrate the trocar sites prior to the incisions.  A 1 cm incision was  made at the umbilicus and dissection  carried down to the midline fascia,  which was sharply incised for 1 cm and the peritoneum entered under direct  vision.  Through a mattress suture of 0 Vicryl, the Hasson trocar was placed  and pneumoperitoneum established. Under direct vision a 10 mm trocar was  placed in the subxiphoid area and two 5 mm trocars along the right subcostal  margin.  The gallbladder was visualized and grossly appeared normal.  The  fundus was grasped and elevated up over the liver and the infundibulum  retracted inferolaterally.  Fibrofatty tissue was stripped off the neck of  the gallbladder toward the porta hepatis.  The distal gallbladder and  Calot's triangle was thoroughly dissected.  The cystic duct-gallbladder  junction was identified and the cystic duct dissected over about a  centimeter and the cystic duct-gallbladder junction dissected 360 degrees.  The cystic artery was identified in Calot's triangle.  When the anatomy was  cleared, the cystic duct was clipped at the gallbladder junction and  operative cholangiogram was  obtained through the cystic duct.  This showed  good filling of a normal common bile duct and intrahepatic ducts with free  flow into the duodenum and no filling defects.  Following this, the  cholangiocatheter was removed and the cystic duct triply clipped proximally  and divided.  The cystic artery was doubly clipped proximally, clipped  distally, and divided.  The gallbladder was dissected from its bed using  hook cautery and removed intact through the umbilicus.  The right upper  quadrant was irrigated and complete hemostasis assured.  Trocars removed  under direct vision and all CO2 evacuated from the peritoneal  cavity.  The mattress suture was secured at the umbilicus.  Skin incisions  were closed with interrupted subcuticular 4-0 nylon and Steri-Strips.  Sponge, needle, and instrument counts were correct.  Dry sterile dressings  were applied and the patient taken to the  recovery room in good condition.                                               Lorne Skeens. Hoxworth, M.D.    Tory Emerald  D:  09/23/2002  T:  09/23/2002  Job:  [id number]

## 2010-12-23 NOTE — Medication Information (Signed)
Summary: rov/sp  Anticoagulant Therapy  Managed by: Bethena Midget, RN, BSN Referring MD: Tonny Bollman PCP: Illene Regulus, M.D. Supervising MD: Riley Kill MD, Maisie Fus Indication 1: Mitral Valve Disorder (ICD-424.0) Indication 2: Cerebral Artery Embolism (ICD-434.1) Lab Used: [facility] Site: [facility] INR POC 3.0 INR RANGE 3.5 - 4  Dietary changes: no    Health status changes: no    Bleeding/hemorrhagic complications: no    Recent/future hospitalizations: no    Any changes in medication regimen? no    Recent/future dental: no  Any missed doses?: no       Is patient compliant with meds? yes       Allergies: 1)  ! Neomycin 2)  ! Tylox 3)  ! Levaquin 4)  ! * Statins  Anticoagulation Management History:      The patient is taking warfarin and comes in today for a routine follow up visit.  Positive risk factors for bleeding include an age of 75 years or older and history of CVA/TIA.  The bleeding index is 'intermediate risk'.  Positive CHADS2 values include History of CHF, Age > 75 years old, and Prior Stroke/CVA/TIA.  The start date was 03/23/1998.  Her last INR was 7.8 ratio.  Anticoagulation responsible provider: Riley Kill MD, Maisie Fus.  INR POC: 3.0.  Cuvette Lot#: [id number].  Exp: 12/2011.    Anticoagulation Management Assessment/Plan:      The patient's current anticoagulation dose is Coumadin 4 mg tabs: Use as directed per anticoagulation clinic.  The target INR is 3.5 - 4.  The next INR is due 11/30/2010.  Anticoagulation instructions were given to patient.  Results were reviewed/authorized by Bethena Midget, RN, BSN.  She was notified by Bethena Midget, RN, BSN.         Prior Anticoagulation Instructions: INR 2.8  Take extra 1/2 tablet today then resume same dose of 1 tablet every day except 1/2 tablet on Monday.  Recheck INR in 2 weeks.   Current Anticoagulation Instructions: INR 3.0 Today take extra 1/2 pill then change dose to 1 pill everyday. Recheck in 12 days.

## 2010-12-23 NOTE — Letter (Signed)
Summary: [facility] Office Visit Note   [facility] Office Visit Note   Imported By: Roderic Ovens 11/02/2010 15:52:02  _____________________________________________________________________  External Attachment:    Type:   Image     Comment:   External Document

## 2010-12-23 NOTE — Medication Information (Signed)
Summary: rov/tm  Anticoagulant Therapy  Managed by: Weston Brass, PharmD Referring MD: Tonny Bollman PCP: Illene Regulus, M.D. Supervising MD: Gala Romney MD, Reuel Boom Indication 1: Mitral Valve Disorder (ICD-424.0) Indication 2: Cerebral Artery Embolism (ICD-434.1) Lab Used: [facility] Site: [facility] INR POC 2.8 INR RANGE 3.5 - 4  Dietary changes: no    Health status changes: no    Bleeding/hemorrhagic complications: no    Recent/future hospitalizations: no    Any changes in medication regimen? no    Recent/future dental: no  Any missed doses?: no       Is patient compliant with meds? yes       Allergies: 1)  ! Neomycin 2)  ! Tylox 3)  ! Levaquin 4)  ! * Statins  Anticoagulation Management History:      The patient is taking warfarin and comes in today for a routine follow up visit.  Positive risk factors for bleeding include an age of 75 years or older and history of CVA/TIA.  The bleeding index is 'intermediate risk'.  Positive CHADS2 values include History of CHF, Age > 28 years old, and Prior Stroke/CVA/TIA.  The start date was 03/23/1998.  Her last INR was 7.8 ratio.  Anticoagulation responsible provider: Terrel Nesheiwat MD, Reuel Boom.  INR POC: 2.8.  Exp: 10/2011.    Anticoagulation Management Assessment/Plan:      The patient's current anticoagulation dose is Coumadin 4 mg tabs: Use as directed per anticoagulation clinic.  The target INR is 3.5 - 4.  The next INR is due 11/17/2010.  Anticoagulation instructions were given to patient.  Results were reviewed/authorized by Weston Brass, PharmD.  She was notified by Weston Brass PharmD.         Prior Anticoagulation Instructions: INR 2.7 Today take 1.5 tablets and Wednesday,  then resume 1 tablet everyday except 1/2 tablet on Mondays and Fridays. Recheck in 2 weeks.   Current Anticoagulation Instructions: INR 2.8  Take extra 1/2 tablet today then resume same dose of 1 tablet every day except 1/2 tablet on Monday.  Recheck INR  in 2 weeks.

## 2010-12-23 NOTE — Medication Information (Signed)
Summary: rov/tm  Anticoagulant Therapy  Managed by: Bethena Midget, RN, BSN Referring MD: Tonny Bollman PCP: Illene Regulus, M.D. Supervising MD: Gala Romney MD, Reuel Boom Indication 1: Mitral Valve Disorder (ICD-424.0) Indication 2: Cerebral Artery Embolism (ICD-434.1) Lab Used: [facility] Site: [facility] INR POC 4.0 INR RANGE 3.5 - 4  Dietary changes: no    Health status changes: no    Bleeding/hemorrhagic complications: yes       Details: Bloody nasal mucus   Recent/future hospitalizations: no    Any changes in medication regimen? no    Recent/future dental: no  Any missed doses?: no       Is patient compliant with meds? yes       Allergies: 1)  ! Neomycin 2)  ! Tylox 3)  ! Levaquin 4)  ! * Statins  Anticoagulation Management History:      The patient is taking warfarin and comes in today for a routine follow up visit.  Positive risk factors for bleeding include an age of 68 years or older and history of CVA/TIA.  The bleeding index is 'intermediate risk'.  Positive CHADS2 values include History of CHF, Age > 57 years old, and Prior Stroke/CVA/TIA.  The start date was 03/23/1998.  Her last INR was 7.8 ratio.  Anticoagulation responsible provider: Auset Fritzler MD, Reuel Boom.  INR POC: 4.0.  Cuvette Lot#: [id number].  Exp: 12/2011.    Anticoagulation Management Assessment/Plan:      The patient's current anticoagulation dose is Coumadin 4 mg tabs: Use as directed per anticoagulation clinic.  The target INR is 3.5 - 4.  The next INR is due 12/20/2010.  Anticoagulation instructions were given to patient.  Results were reviewed/authorized by Bethena Midget, RN, BSN.  She was notified by Stephannie Peters, PharmD Candidate .         Prior Anticoagulation Instructions: INR 3.0 Today take extra 1/2 pill then change dose to 1 pill everyday. Recheck in 12 days.   Current Anticoagulation Instructions: INR 4.0  Coumadin 4 mg tablets - Continue 1 tablet every day

## 2010-12-23 NOTE — Consult Note (Signed)
Summary: Consultation    NAME:  Doris [name], Doris [name] NO.:  [id number]   MEDICAL RECORD NO.:  [id number]                   PATIENT TYPE:  INP   LOCATION:  3712                                 FACILITY:  [facility]   PHYSICIAN:  Willa Rough, MD [facility]                DATE OF BIRTH:  09/27/1924   DATE OF CONSULTATION:  09/19/2002  DATE OF DISCHARGE:                                   CONSULTATION   REASON FOR CONSULTATION:  This delightful 77 year old female patient needs  to have a cholecystectomy.  Her cardiac status has been followed by Dr. Cecil Cranker.  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  Hemodynamically, she has done very well over the  years.  It is my understanding that she has had an embolic CVA, although I  am not sure that it has had any significant sequelae.  Anticoagulation is  extremely important for her.   Cholecystectomy is scheduled over the next few days.  She is now admitted  for careful initiation of heparin as her Coumadin is stopped to carefully  keep her anticoagulated.   As mentioned, she has no chest pain or dyspnea.  There is no edema.   ALLERGIES:  TYLOX and TOPICAL NEOMYCIN.   MEDICATIONS:  1. Lanoxin 0.125 mg q.d.  2. Verapamil 180 mg q.d.  3. Multivitamin.  4. Baby aspirin 81 mg q.d.  5. Coumadin 4 mg q.d. except 2 mg on Sunday.  6. Lipitor 10 mg q.d.   PAST MEDICAL HISTORY:  See the complete list of problems from the past in  the final problem list at the end of this dictation.   SOCIAL HISTORY:  The patient lives alone in [place].  She is widowed and  retired.  She does not smoke.  She does not drink alcohol.   FAMILY HISTORY:  There is no family history in either parent for coronary  artery disease.   REVIEW OF SYMPTOMS:  She has had no fevers or chills.  HEENT:  Reveals no  problems.  SKIN:  She has had no skin rashes.  RESPIRATORY:  There has been  no shortness of  breath or chest pain.  GU/GI:  She is having no GU or GI  symptoms.  MUSCULOSKELETAL/NEUROLOGIC:  She has no musculoskeletal or  neurologic symptoms.  The remainder of the review of systems is negative.   PHYSICAL EXAMINATION:  VITAL SIGNS:  Blood pressure 110/64, temperature 97.9  with a pulse of 62, and respirations are 20.  GENERAL:  She is quite stable.  HEENT:  There are no obvious significant problems.  NECK:  Soft.  There are no bruits.  SKIN:  There are no obvious skin lesions.  CARDIOVASCULAR:  S1.  This is a crisp closure sound from her mechanical  prosthesis.  There is a normal S2 and I  believe I can hear a soft opening  click of her Bjork-Shiley valve.  There is no obvious mitral regurgitation  heard.  LUNGS:  Clear.  ABDOMEN:  Soft with normal bowel sounds.  GENITOURINARY:  Deferred.  RECTAL:  Deferred.  EXTREMITIES:  No obvious edema.  NEUROLOGICAL:  She is grossly intact.   LABORATORY DATA:  Chest x-ray is pending and EKG is still pending.  Labs are  reviewed in the chart.  She does have a hemoglobin of 13.7 with platelets of  233,000.  Potassium is 3.9 with a BUN of 15, and a creatinine of 0.7.  Her  INR is 1.9 and heparin has now been started.   IMPRESSION:  1. Need for cholecystectomy and the patient has now had Coumadin stopped and     heparin started and we will help oversee this.  2. History of rheumatic heart disease with a mechanical valve replacement     with a Bjork-Shiley valve in 1981.  Fortunately, she has done extremely     well with this over time.  3. Long-term Coumadin therapy.  4. History of chronic atrial fibrillation with a controlled ventricular     response.  5. History of rheumatic heart disease.  6. History of a cerebrovascular accident secondary to a embolus at some time     in the past but no obvious significant residual.  7. History of hypertension, controlled.  8. History of good left ventricular function.  9. History of elevated  cholesterol, treated.  10.      History of esophageal dilatation.  11.      History of normal coronaries at the time of catheterization in the     past.   PLAN:  The patient will be watched very carefully from the anticoagulation  view point.  Surgery will be scheduled when her INR is low enough and Dr.  Sharlet Salina T. Hoxworth can proceed with the operation.                                               Willa Rough, MD [facility]    JK/MEDQ  D:  09/19/2002  T:  09/19/2002  Job:  [id number]   cc:   Lorne Skeens. Hoxworth, M.D.  Fax: [phone]

## 2010-12-23 NOTE — Progress Notes (Signed)
Summary: [facility]   Imported By: Sherian Rein 12/09/2010 14:55:02  _____________________________________________________________________  External Attachment:    Type:   Image     Comment:   External Document

## 2010-12-23 NOTE — Progress Notes (Signed)
Summary: ? re meds  Phone Note Call from Patient Call back at Home Phone [phone]   Caller: Patient Call For: Dr [name] Reason for Call: Talk to Nurse Summary of Call: Patient has questions regarding her meds Initial call taken by: Tawni Levy,  December 03, 2010 3:34 PM  Follow-up for Phone Call        called patient and she wanted to know if she is to continue taking the Aciphex after samples fun out.  She states that it is helping her alot and her reflux symptoms are gone since starting the medication.  She also stated that her constipation is so much better after starting Metamucil.  I advised her to continue taking the Aciphex as long as it is helping her symptoms.  I will send in a Rx. for her to pick up at [facility].    She also commented on how nice Dr. [name] and I was to her and she really appreciated it.   Follow-up by: Milford Cage NCMA,  December 03, 2010 4:08 PM    New/Updated Medications: ACIPHEX 20 MG TBEC (RABEPRAZOLE SODIUM) 1 by mouth once daily Prescriptions: ACIPHEX 20 MG TBEC (RABEPRAZOLE SODIUM) 1 by mouth once daily  #30 x 11   Entered by:   Milford Cage NCMA   Authorized by:   Hilarie Fredrickson MD   Signed by:   Milford Cage NCMA on 12/03/2010   Method used:   Electronically to        [facility]* (retail)       [address]       [place], [place]  [id number]       Ph: [phone]       Fax: [phone]   RxID:   [id number]

## 2010-12-23 NOTE — Progress Notes (Signed)
Summary: [facility]   Imported By: Sherian Rein 12/09/2010 14:56:01  _____________________________________________________________________  External Attachment:    Type:   Image     Comment:   External Document

## 2010-12-23 NOTE — Procedures (Signed)
Summary: EGD/[place]  EGD/[place]   Imported By: Sherian Rein 12/09/2010 14:46:01  _____________________________________________________________________  External Attachment:    Type:   Image     Comment:   External Document

## 2010-12-23 NOTE — Progress Notes (Signed)
Summary: [facility]   Imported By: Sherian Rein 12/09/2010 14:53:04  _____________________________________________________________________  External Attachment:    Type:   Image     Comment:   External Document

## 2010-12-23 NOTE — Progress Notes (Signed)
Summary: Medication  Phone Note Call from Patient Call back at Home Phone [phone]   Caller: Patient Call For: Dr. [name] Reason for Call: Talk to Nurse Summary of Call: Pt needs to speak with nurse  about her medication Initial call taken by: [place] Johnson,  December 13, 2010 12:52 PM  Follow-up for Phone Call        Pt. got notification from [facility] that they will not cover Aciphex when this 30 day supply is gone as it is not on her formulary..She is going to check what is on her formulary and will let us know when she is near the end of her current rx. Follow-up by: Teryl Lucy RN,  December 13, 2010 3:21 PM

## 2011-01-05 ENCOUNTER — Encounter (INDEPENDENT_AMBULATORY_CARE_PROVIDER_SITE_OTHER): Payer: Self-pay | Admitting: Pharmacist

## 2011-01-05 ENCOUNTER — Encounter (INDEPENDENT_AMBULATORY_CARE_PROVIDER_SITE_OTHER): Payer: Medicare Other

## 2011-01-05 DIAGNOSIS — I059 Rheumatic mitral valve disease, unspecified: Secondary | ICD-10-CM

## 2011-01-05 DIAGNOSIS — Z7901 Long term (current) use of anticoagulants: Secondary | ICD-10-CM

## 2011-01-12 NOTE — Medication Information (Signed)
Summary: [facility]  Anticoagulant Therapy  Managed by: Weston Brass, PharmD Referring MD: Tonny Bollman PCP: Illene Regulus, MD Supervising MD: Gala Romney MD, Reuel Boom Indication 1: Mitral Valve Disorder (ICD-424.0) Indication 2: Cerebral Artery Embolism (ICD-434.1) Lab Used: [facility] Site: [facility] INR POC 4.3 INR RANGE 3.5 - 4  Dietary changes: no    Health status changes: no    Bleeding/hemorrhagic complications: no    Recent/future hospitalizations: no    Any changes in medication regimen? no    Recent/future dental: no  Any missed doses?: no       Is patient compliant with meds? yes      Comments: Patient fell 10 days ago and hit her knee on the carpet.  Allergies: 1)  ! Neomycin 2)  ! Tylox 3)  ! Levaquin 4)  ! * Statins  Anticoagulation Management History:      The patient is taking warfarin and comes in today for a routine follow up visit.  Positive risk factors for bleeding include an age of 75 years or older and history of CVA/TIA.  The bleeding index is 'intermediate risk'.  Positive CHADS2 values include History of CHF, Age > 66 years old, and Prior Stroke/CVA/TIA.  The start date was 03/23/1998.  Her last INR was 7.8 ratio.  Anticoagulation responsible provider: Bensimhon MD, Reuel Boom.  INR POC: 4.3.  Cuvette Lot#: [id number].  Exp: 11/2011.    Anticoagulation Management Assessment/Plan:      The patient's current anticoagulation dose is Coumadin 4 mg tabs: Use as directed per anticoagulation clinic.  The target INR is 3.5 - 4.  The next INR is due 01/26/2011.  Anticoagulation instructions were given to patient.  Results were reviewed/authorized by Weston Brass, PharmD.  She was notified by Margot Chimes PharmD Candidate.         Prior Anticoagulation Instructions: INR 3.9 Continue taking 1 tablet every day. Check in 3 weeks.  Current Anticoagulation Instructions: INR 4.3  Tomorrow take 1/2 tablet and then resume your normal dose of 1 tablet  everyday.  Recheck INR in 3 weeks.

## 2011-01-25 ENCOUNTER — Encounter: Payer: Self-pay | Admitting: Cardiovascular Disease

## 2011-01-25 DIAGNOSIS — G459 Transient cerebral ischemic attack, unspecified: Secondary | ICD-10-CM

## 2011-01-25 DIAGNOSIS — I4891 Unspecified atrial fibrillation: Secondary | ICD-10-CM

## 2011-01-25 DIAGNOSIS — I6350 Cerebral infarction due to unspecified occlusion or stenosis of unspecified cerebral artery: Secondary | ICD-10-CM

## 2011-01-25 DIAGNOSIS — I059 Rheumatic mitral valve disease, unspecified: Secondary | ICD-10-CM

## 2011-01-26 ENCOUNTER — Encounter (INDEPENDENT_AMBULATORY_CARE_PROVIDER_SITE_OTHER): Payer: Medicare Other

## 2011-01-26 ENCOUNTER — Encounter: Payer: Self-pay | Admitting: Internal Medicine

## 2011-01-26 DIAGNOSIS — I6340 Cerebral infarction due to embolism of unspecified cerebral artery: Secondary | ICD-10-CM

## 2011-01-26 DIAGNOSIS — Z7901 Long term (current) use of anticoagulants: Secondary | ICD-10-CM

## 2011-01-26 DIAGNOSIS — I059 Rheumatic mitral valve disease, unspecified: Secondary | ICD-10-CM

## 2011-02-01 NOTE — Medication Information (Signed)
Summary: rov/sp  Anticoagulant Therapy  Managed by: Weston Brass, PharmD Referring MD: Tonny Bollman PCP: Illene Regulus, MD Supervising MD: Graciela Husbands MD, Viviann Spare Indication 1: Mitral Valve Disorder (ICD-424.0) Indication 2: Cerebral Artery Embolism (ICD-434.1) Lab Used: [facility] Site: [facility] INR POC 3.8 INR RANGE 3.5 - 4  Dietary changes: no    Health status changes: no    Bleeding/hemorrhagic complications: no    Recent/future hospitalizations: no    Any changes in medication regimen? no    Recent/future dental: no  Any missed doses?: no       Is patient compliant with meds? yes       Allergies: 1)  ! Neomycin 2)  ! Tylox 3)  ! Levaquin 4)  ! * Statins  Anticoagulation Management History:      The patient is taking warfarin and comes in today for a routine follow up visit.  Positive risk factors for bleeding include an age of 75 years or older and history of CVA/TIA.  The bleeding index is 'intermediate risk'.  Positive CHADS2 values include History of CHF, Age > 19 years old, and Prior Stroke/CVA/TIA.  The start date was 03/23/1998.  Her last INR was 7.8 ratio.  Anticoagulation responsible provider: Graciela Husbands MD, Viviann Spare.  INR POC: 3.8.  Cuvette Lot#: [id number].  Exp: 11/2011.    Anticoagulation Management Assessment/Plan:      The patient's current anticoagulation dose is Coumadin 4 mg tabs: Use as directed per anticoagulation clinic.  The target INR is 3.5 - 4.  The next INR is due 02/16/2011.  Anticoagulation instructions were given to patient.  Results were reviewed/authorized by Weston Brass, PharmD.  She was notified by Weston Brass PharmD.         Prior Anticoagulation Instructions: INR 4.3  Tomorrow take 1/2 tablet and then resume your normal dose of 1 tablet everyday.  Recheck INR in 3 weeks.    Current Anticoagulation Instructions: INR 3.8  Continue same dose of 1 tablet every day.  Recheck INR in 3 weeks.

## 2011-02-10 ENCOUNTER — Other Ambulatory Visit: Payer: Self-pay

## 2011-02-10 DIAGNOSIS — I059 Rheumatic mitral valve disease, unspecified: Secondary | ICD-10-CM

## 2011-02-10 DIAGNOSIS — Z954 Presence of other heart-valve replacement: Secondary | ICD-10-CM

## 2011-02-10 MED ORDER — WARFARIN SODIUM 4 MG PO TABS: ORAL_TABLET | ORAL | Status: DC

## 2011-02-11 ENCOUNTER — Other Ambulatory Visit: Payer: Self-pay

## 2011-02-11 DIAGNOSIS — Z954 Presence of other heart-valve replacement: Secondary | ICD-10-CM

## 2011-02-11 DIAGNOSIS — I059 Rheumatic mitral valve disease, unspecified: Secondary | ICD-10-CM

## 2011-02-11 MED ORDER — WARFARIN SODIUM 4 MG PO TABS: ORAL_TABLET | ORAL | Status: DC

## 2011-02-12 ENCOUNTER — Encounter: Payer: Self-pay | Admitting: Cardiovascular Disease

## 2011-02-14 ENCOUNTER — Telehealth (INDEPENDENT_AMBULATORY_CARE_PROVIDER_SITE_OTHER): Payer: Medicare Other | Admitting: Internal Medicine

## 2011-02-14 DIAGNOSIS — K219 Gastro-esophageal reflux disease without esophagitis: Secondary | ICD-10-CM

## 2011-02-14 NOTE — Telephone Encounter (Signed)
Patient wants to know if she is still supposed to be taking the prilosec. If so she wants [place] to call in a script for this. Dr. [name] please advise.

## 2011-02-14 NOTE — Telephone Encounter (Signed)
Yes  Refill  

## 2011-02-15 MED ORDER — OMEPRAZOLE 20 MG PO CPDR: 20.0000 mg | DELAYED_RELEASE_CAPSULE | Freq: Every day | ORAL | Status: DC

## 2011-02-15 NOTE — Telephone Encounter (Signed)
Patient notified that she is to continue taking the Prilosec. Rx sent to [facility].

## 2011-02-16 ENCOUNTER — Ambulatory Visit (INDEPENDENT_AMBULATORY_CARE_PROVIDER_SITE_OTHER): Payer: Medicare Other | Admitting: *Deleted

## 2011-02-16 DIAGNOSIS — I059 Rheumatic mitral valve disease, unspecified: Secondary | ICD-10-CM

## 2011-02-16 DIAGNOSIS — I4891 Unspecified atrial fibrillation: Secondary | ICD-10-CM

## 2011-02-16 DIAGNOSIS — G459 Transient cerebral ischemic attack, unspecified: Secondary | ICD-10-CM

## 2011-02-16 DIAGNOSIS — I6350 Cerebral infarction due to unspecified occlusion or stenosis of unspecified cerebral artery: Secondary | ICD-10-CM

## 2011-02-16 NOTE — Patient Instructions (Signed)
Today only take 1/2 pill then resume 1 pill everyday . Incorporate more leafy veggies in diet

## 2011-02-22 ENCOUNTER — Ambulatory Visit (INDEPENDENT_AMBULATORY_CARE_PROVIDER_SITE_OTHER): Payer: Medicare Other | Admitting: Cardiovascular Disease

## 2011-02-22 ENCOUNTER — Telehealth: Payer: Self-pay | Admitting: Cardiovascular Disease

## 2011-02-22 ENCOUNTER — Encounter: Payer: Self-pay | Admitting: Cardiovascular Disease

## 2011-02-22 DIAGNOSIS — I5032 Chronic diastolic (congestive) heart failure: Secondary | ICD-10-CM

## 2011-02-22 DIAGNOSIS — I509 Heart failure, unspecified: Secondary | ICD-10-CM

## 2011-02-22 DIAGNOSIS — I4891 Unspecified atrial fibrillation: Secondary | ICD-10-CM

## 2011-02-22 DIAGNOSIS — I059 Rheumatic mitral valve disease, unspecified: Secondary | ICD-10-CM

## 2011-02-22 NOTE — Telephone Encounter (Signed)
Pt was seen today, checked med list when she got home, it showed acithex and she isn't taking that anymore, and her paper said to stop taking omaprazel but she's still taking it-if need to call [phone]

## 2011-02-22 NOTE — Assessment & Plan Note (Signed)
Continue rate control and anticoagulation. The patient reports no recent TIA symptoms and she remains on warfarin and low-dose aspirin.

## 2011-02-22 NOTE — Telephone Encounter (Signed)
I spoke with the pt and clarified her medications.  Medication list updated.

## 2011-02-22 NOTE — Patient Instructions (Signed)
Your physician recommends that you continue on your current medications as directed. Please refer to the Current Medication list given to you today.  Your physician wants you to follow-up in: 6 MONTHS. You will receive a reminder letter in the mail two months in advance. If you don't receive a letter, please call our office to schedule the follow-up appointment.  

## 2011-02-22 NOTE — Assessment & Plan Note (Signed)
The patient overall appears stable. Her echocardiogram from last year was reviewed and shows a normal functioning mitral prosthesis. I suspect her mitral valve disease and permanent atrial fibrillation contribute to shortness of breath, but I don't think she has active signs of congestive heart failure and would not recommend modifying her medications at present. She should continue full anticoagulation with warfarin.

## 2011-02-22 NOTE — Progress Notes (Signed)
HPI:  77 year-old woman with mitral valve disease and hx of MV replacement with Bjork-Shiley mitral prosthesis in 1981as well as  permanent atrial fibrillation on chronic anticoagulation with coumadin.  Her most recent echo from 2011 shows a normal functioning mitral prosthesis, normal LV function, normal estimate of pulmonary pressures, normal RV function, and severe biatrial enlargement.  Overall the patient reports stable symptoms. She complains of dyspnea with exertion. Symptoms occur with light yard work, carrying groceries, or brisk walking. She denies orthopnea, PND, edema, or palpitations. She has no chest pain. She reports gait instability and has been followed by Dr. Sandria Manly with neurology.  Outpatient Encounter Prescriptions as of 02/22/2011  Medication Sig Dispense Refill  . ALPRAZolam (XANAX) 0.25 MG tablet Take 0.25 mg by mouth at bedtime as needed.        Marland Kitchen aspirin 81 MG tablet Take 81 mg by mouth daily.        . calcium carbonate (TUMS - DOSED IN MG ELEMENTAL CALCIUM) 500 MG chewable tablet Chew 1 tablet by mouth as needed.        . furosemide (LASIX) 40 MG tablet Take 40 mg by mouth daily.        . Multiple Vitamin (MULTIVITAMIN) tablet Take 1 tablet by mouth daily.        Marland Kitchen omeprazole (PRILOSEC) 20 MG capsule Take 1 capsule (20 mg total) by mouth daily.  30 capsule  3  . Psyllium (METAMUCIL) 30.9 % POWD Take by mouth as needed. Use once daily      . tetrahydrozoline 0.05 % ophthalmic solution 1 drop as needed.        . verapamil (CALAN-SR) 180 MG CR tablet Take 180 mg by mouth. Take 1 1/2 tablet daily       . warfarin (COUMADIN) 4 MG tablet Take as directed by Anticoagulation clinic   30 tablet  3  . RABEprazole (ACIPHEX) 20 MG tablet Take 20 mg by mouth daily.        Marland Kitchen DISCONTD: omeprazole (PRILOSEC) 20 MG capsule Take 20 mg by mouth daily.          Allergies  Allergen Reactions  . Levofloxacin   . Neomycin   . Oxycodone-Acetaminophen   . Statins     Past Medical History    Diagnosis Date  . Bacterial pneumonia     hx of  . Herpes zoster     hx of  . Gout   . CVA (cerebral vascular accident)   . Gastritis, acute     without mention of hemorrhage   . Trigeminal neuralgia   . Stricture and stenosis of esophagus   . Esophageal reflux   . Mitral valve disorders     hx of  . Unspecified peritonitis     hx of  . Acute rheumatic heart disease, unspecified     hx of  . Unspecified transient cerebral ischemia   . Atrial fibrillation   . Hx of mitral valve replacement   . Chronic diastolic heart failure   . Hyperplastic polyps of stomach   . Diverticulosis     ROS: Negative except as per HPI  BP 110/54  Pulse 74  Resp 18  Ht 5\' 3"  (1.6 m)  Wt 108 lb 1.9 oz (49.043 kg)  BMI 19.15 kg/m2  PHYSICAL EXAM: Pt is alert and oriented, pleasant, elderly woman, in NAD HEENT: normal Neck: JVP - normal, carotids 2+= without bruits Lungs: CTA bilaterally CV: RRR with normal mechanical heart sounds  with a grade 2/6 systolic murmur along the left sternal border Abd: soft, NT, Positive BS, no hepatomegaly Ext: no C/C/E, distal pulses intact and equal Skin: warm/dry no rash  EKG:  Atrial fibrillation with heart rate 80 beats per minute, ST and T wave abnormality consider lateral ischemia versus dig effect, prolonged QT.  ASSESSMENT AND PLAN:

## 2011-03-09 ENCOUNTER — Ambulatory Visit (INDEPENDENT_AMBULATORY_CARE_PROVIDER_SITE_OTHER): Payer: Medicare Other | Admitting: *Deleted

## 2011-03-09 DIAGNOSIS — I6350 Cerebral infarction due to unspecified occlusion or stenosis of unspecified cerebral artery: Secondary | ICD-10-CM

## 2011-03-09 DIAGNOSIS — I059 Rheumatic mitral valve disease, unspecified: Secondary | ICD-10-CM

## 2011-03-09 DIAGNOSIS — G459 Transient cerebral ischemic attack, unspecified: Secondary | ICD-10-CM

## 2011-03-09 DIAGNOSIS — I4891 Unspecified atrial fibrillation: Secondary | ICD-10-CM

## 2011-03-09 LAB — POCT INR: INR: 4.5

## 2011-03-22 ENCOUNTER — Encounter: Payer: Medicare Other | Admitting: *Deleted

## 2011-03-22 ENCOUNTER — Ambulatory Visit (INDEPENDENT_AMBULATORY_CARE_PROVIDER_SITE_OTHER): Payer: Medicare Other | Admitting: *Deleted

## 2011-03-22 DIAGNOSIS — I6350 Cerebral infarction due to unspecified occlusion or stenosis of unspecified cerebral artery: Secondary | ICD-10-CM

## 2011-03-22 DIAGNOSIS — G459 Transient cerebral ischemic attack, unspecified: Secondary | ICD-10-CM

## 2011-03-22 DIAGNOSIS — I059 Rheumatic mitral valve disease, unspecified: Secondary | ICD-10-CM

## 2011-03-22 DIAGNOSIS — I4891 Unspecified atrial fibrillation: Secondary | ICD-10-CM

## 2011-03-28 ENCOUNTER — Other Ambulatory Visit: Payer: Self-pay | Admitting: *Deleted

## 2011-03-28 MED ORDER — FUROSEMIDE 40 MG PO TABS: 40.0000 mg | ORAL_TABLET | Freq: Every day | ORAL | Status: DC

## 2011-04-05 ENCOUNTER — Ambulatory Visit (INDEPENDENT_AMBULATORY_CARE_PROVIDER_SITE_OTHER): Payer: Medicare Other | Admitting: *Deleted

## 2011-04-05 DIAGNOSIS — I059 Rheumatic mitral valve disease, unspecified: Secondary | ICD-10-CM

## 2011-04-05 DIAGNOSIS — I4891 Unspecified atrial fibrillation: Secondary | ICD-10-CM

## 2011-04-05 DIAGNOSIS — I6350 Cerebral infarction due to unspecified occlusion or stenosis of unspecified cerebral artery: Secondary | ICD-10-CM

## 2011-04-05 DIAGNOSIS — G459 Transient cerebral ischemic attack, unspecified: Secondary | ICD-10-CM

## 2011-04-05 NOTE — H&P (Signed)
[name], Doris [name] NO.:  [id number]   MEDICAL RECORD NO.:  [id number]          PATIENT TYPE:  INP   LOCATION:  3003                         FACILITY:  [facility]   PHYSICIAN:  Mick Sell, MD DATE OF BIRTH:  07/18/1924   DATE OF ADMISSION:  06/08/2008  DATE OF DISCHARGE:                              HISTORY & PHYSICAL   PRIMARY CARE PHYSICIAN:  Doris Doris [name], M.D.   CHIEF COMPLAINT:  Weakness.   HISTORY OF PRESENT ILLNESS:  This is an 77 year old white female with a  history of mitral valve prosthesis as well as chronic atrial  fibrillation and prior TIAs while on Coumadin who for approximately 38  hours now has had difficulty walking.  She says this first began 1-1/2  days ago when she woke up in the middle of the night to use the  bathroom.  She had difficulty walking, but did not fall.  She went back  to bed and was relatively okay in the morning, but throughout the day,  has had increasing difficulty walking.  She also reports feeling shaky.  She denies any fevers, chills, night sweats, weight loss headache,  nausea, vomiting, abdominal pain, diarrhea, chest pain, dysuria.  She  says she has been noted to have some weakness in her left leg.  She  actually feels like she is getting back to her baseline now.  Of note,  she was seen in Coumadin clinic 2 weeks ago when her INR was 4.1.  She  says her goal is 3.5-4.0 given her history of a mechanical prosthetic  valve and her TIAs on Coumadin.  At that last visit, her dose was  slightly decreased, and she was due to be checked again in the next few  days.   PAST MEDICAL HISTORY:  1. Mitral valve prosthesis 28 years ago.  Her goal for anticoagulation      is 3.5-4.0.  2. History of atrial fibrillation.  3. History of TIAs on Coumadin.   SOCIAL HISTORY:  The patient lives alone.  She does not smoke or drink  or use drugs.  Her family is involved and her daughter and grandson are  here today.   FAMILY HISTORY:  Noncontributory.   MEDICATIONS:  Obtained from the M-stat list and the patient.  1. Verapamil 270 mg once a day.  2. Lasix 40 mg once a day.  3. Aspirin 81 mg once a day.  4. Multivitamin once a day.  5. Coumadin.  Apparently she takes 4 mg daily except on Tuesday,      Thursday and Saturday, at which point she takes 2 mg.   ALLERGIES:  NEOMYCIN AND TYLOX.   REVIEW OF SYSTEMS:  She had 11 systems reviewed and negative except as  per HPI.   PHYSICAL EXAMINATION:  GENERAL:  Patient is a pleasant elderly female in  no acute distress.  She is very Estate agent.  VITAL SIGNS:  Temperature is 97.8, pulse of 50, blood pressure 124/67,  respirations 21 and saturation 98% on room air.  HEENT:  Pupils equal, round, reactive to light and accommodation.  Extraocular movements are intact.  Sclerae are anicteric.  Cranial  nerves II-XII are grossly intact except for mild left mass.  But the  patient says this has been present in the past.  NECK:  Supple.  Her oropharynx is clear.  HEART:  Irregular and bradycardic.  There is a loud click and murmur.  LUNGS:  Clear to auscultation bilaterally.  ABDOMEN:  Soft, nontender, nondistended.  No hepatosplenomegaly.  EXTREMITIES:  No clubbing, cyanosis or edema.  NEUROLOGICAL:  She is alert and oriented x3, interactive and quite  pleasant.  Cranial nerves II-XII grossly intact except for the above  noted, probably old left mouth droop.  Her upper extremity strength is  5/5, in her elbow flexion, extension, wrists and shoulders.  In her  lower extremity, her left lower extremity appears to be weaker than the  right.  She says she is having difficulty raising it off the bed,  however, when I raise it and hold it in position for her, she is able to  hold it up against resistance.  She is also able to flex at the hip and  pull her leg up, and when asked, she is able to extend her knee with leg  kept in that position.  Her  reflexes are 2+ and symmetrical.  Babinski's  are normal.   DATA:  The patient had a white count 5.4, hemoglobin 14.1, platelets  178.  Basic panel with a sodium 136, K 3.8, chloride 105, bicarb 95, BUN  18, creatinine 1.1, glucose 95, D-dimer was negative at 0.37.  Her INR  is 2.8.  Cardiac enzymes are negative with a troponin less than 0.05.  Urinalysis negative.  She had chest x-ray which showed moderate  cardiomegaly and otherwise no active disease.  She had a CT of her head  which showed stable atrophy and a question right cerebellopontine  inclusion.  EKG showed is atrial fibrillation with a slow response and  some poor R wave progression as well as some ST depression in lead II.   IMPRESSION:  This is an 77 year old female with a history of prosthetic  mitral valve replacement as well as a history of atrial fibrillation and  TIAs while on Coumadin.  She is admitted with weakness and difficulty  walking for 2 days.  On exam, apparently there is some minimal left-  sided weakness.  There is no evidence of infection, and her vital signs  were stable.  Of note, her INR is subtherapeutic for her goal of 3.5-  4.0.  Likely, she has had TIA or a new CVA, given her symptomatology.  I  do not see any evidence of infection that could be making her have  weakness.   PLAN:  1. Will check an MRI in the morning.  She did have one several years      ago that was relatively normal as was an MRA at that time.  2. Will continue her Coumadin at a slightly higher dose and ask      pharmacy to consult regarding this.  We will also add Lovenox for      now until her INR is at a goal of 3.5-4.0.  3. Will continue aspirin.  4. For her hypertension, we will be careful with blood pressure      control as to not drop it in the setting of possible acute event.      To that end, will decrease her verapamil from 270-180.  It is  important to continue rate control for her atrial fibrillation.  5. For  her atrial fibrillation, she has been anticoagulated.  Will      continue to monitor her on tele and have rate control with the      verapamil.  Will also restart her outpatient dose of Lasix.  6. The patient will be seen by PT and OT tomorrow to evaluate.      Mick Sell, MD  Electronically Signed    DPF/MEDQ  D:  06/09/2008  T:  06/09/2008  Job:  [id number]

## 2011-04-05 NOTE — Assessment & Plan Note (Signed)
[facility]                            CARDIOLOGY OFFICE NOTE   LOUISE, RAWSON                      MRN:          [id number]  DATE:01/23/2008                            DOB:          29-Jun-1924    Doris [name] was seen in follow-up at [facility] cardiology office on  January 23, 2008.  Doris [name] is a delightful 77 year old woman with a  Bjork-Shiley mitral prosthesis.  It was placed approximately 28 years  ago.  She is on chronic anticoagulation with Coumadin for her mitral  prosthesis as well as chronic atrial fibrillation.  She continues to do  well from a symptomatic standpoint.   Doris [name] has not been active recently.  She feels unsteady on her  feet and is afraid of falling with walking outside.  She remains active  inside the home and does regular housekeeping activities.  She denies  chest pain, dyspnea, orthopnea, PND, edema, palpitations,  lightheadedness or syncope.  When I saw her in September she complained  of transient blurry vision involving the left eye, but she has had no  recurrence of this symptom.  She has had no numbness, tingling or  weakness of her arms or legs.  She denies headache or other complaints  at present.   MEDICATIONS:  1. Coumadin as directed.  2. Aspirin 81 mg daily.  3. Multivitamin daily.  4. Lasix 40 mg daily.   PHYSICAL EXAMINATION:  GENERAL:  She is alert and oriented in no acute  distress, a very pleasant elderly woman.  VITAL SIGNS:  Weight 115, blood pressure 112/68, heart rate 66,  respiratory rate 16.  HEENT:  Normal.  NECK:  Normal carotid upstrokes without bruits, jugular venous pressure  is normal.  LUNGS:  Clear bilaterally.  HEART:  Regular rate and rhythm with a crisp mechanical S1.  There are  no murmurs or gallops.  ABDOMEN:  Soft and nontender, no bruits.  No organomegaly.  EXTREMITIES:  No clubbing, cyanosis or edema.  Peripheral pulses 2+ and  equal throughout.   Echocardiogram from April 2008 showed normal left ventricular size and  function with normal function of her mitral prosthesis.   EKG shows atrial fibrillation with lateral ST-T wave abnormality,  lateral ischemia versus digitalis effect.   ASSESSMENT:  Doris [name] remains stable from a cardiac standpoint with  regard to her mitral prosthesis and chronic atrial fibrillation.  She is  anticoagulated with Coumadin.  Her ventricular rate is well-controlled  on verapamil.  Will continue current medications without changes.  She will continue  regular follow-up with the Coumadin Clinic, and I would like to see her  back in six months as well.     Veverly Fells. Excell Seltzer, MD  Electronically Signed    MDC/MedQ  DD: 01/23/2008  DT: 01/24/2008  Job #: [id number]   cc:   Jethro Bastos, M.D.

## 2011-04-05 NOTE — Consult Note (Signed)
NAMEDEJANIQUE, RUEHL               ACCOUNT NO.:  [id number]   MEDICAL RECORD NO.:  [id number]          PATIENT TYPE:  INP   LOCATION:  3003                         FACILITY:  [facility]   PHYSICIAN:  Pramod P. Pearlean Brownie, MD    DATE OF BIRTH:  December 06, 1923   DATE OF CONSULTATION:  DATE OF DISCHARGE:  06/10/2008                                 CONSULTATION   REFERRING PHYSICIAN:  [facility].   REASON FOR REFERRAL:  Gait difficulties, possible stroke.   HISTORY OF PRESENT ILLNESS:  Ms. Crew is an 77 year old pleasant  Caucasian lady who woke up on Saturday morning at 3 o'clock with some  difficulty going to the bathroom.  She noticed her balance was off.  She  had to hold onto the objects.  She also noticed when she looked at the  LED light on the bedside table, the numbers were moving side to side.  She went back to sleep and woke up on Sunday morning, felt a little  better but slightly off balance.  She was able to dress herself and go  to church, and apparently at church nobody noticed anything wrong.  When  she came back, she was still off balance.  She called Dr. Corinda Gubler who  arranged for her to come to the ER.  She tried reaching Dr. Sandria Manly but  could not reach him.  She has no prior history of stroke or TIA.  She  does have a history of trigeminal neuralgia status post posterior fossa  craniotomy for treatment by Dr. Newell Coral in 2000 with resolution of her  symptoms with residual right-sided deafness.   Past medical history also significant for mechanical mitral valve  replaced in 1981.  She is on long-term Coumadin since then.   HOME MEDICATIONS:  Coumadin, verapamil, Lasix, and aspirin.   MEDICATION ALLERGIES:  NEOMYCIN and TYLOX.   SOCIAL HISTORY:  The patient lives alone.  She is independent with  activities of daily living.  Does not smoke or drink.   Sixteen-system review of systems documented separately in the chart, is  positive for some dizziness, hearing loss,  hypertension, and joint pain.   PHYSICAL EXAMINATION:  GENERAL:  A frail, elderly Caucasian lady who is  not in distress.  VITAL SIGNS:  She is afebrile, pulse rate is 88 and regular, blood  pressure 120/76, and temperature 97.4.  HEENT:  Head is nontraumatic.  ENT exam is unremarkable.  NECK:  Supple.  There is no bruit.  CARDIAC:  No murmur or gallop.  LUNGS:  Clear to auscultation.  ABDOMEN:  Soft and nontender.  NEUROLOGIC:  She is awake, alert, and oriented to time, place, and  person.  There is no aphasia, apraxia, or dysarthria.  She has  significant hearing decrease in the right year, which complicates exam  to some degree.  Eye movements are full range.  There is no nystagmus.  Visual fields and acuity are adequate.  Facies symmetric.  Tongue is  midline.  Motor system exam reveals no upper extremity drift.  She has  symmetric strength.  She has minimum left  hip flexor weakness to my PA's  exam, but she seems fairly symmetric to mine.  She is able to get up.  She walks with slightly broad-based unsteady gait.  She is unsteady  while standing on a narrow base and while standing on the left foot  unsupported.   DATA REVIEWED:  CT scan of the head noncontrast study shows no definite  acute abnormality.  Changes are noted in the posterior fossa on the  right.  There is questionable area of hyperdensity, which is of unclear  significance in the right cerebellum.  EKG shows atrial fibrillation.   IMPRESSION:  An 77 year old lady with 2-day history of gait ataxia and  some visual disturbance, likely a small brainstem infarct, which is not  visualized on CT scan.   PLAN:  I would recommend obtaining an MRI to further confirm the stroke.  The patient has some pacer leads which have been left in her chest since  1981.  She informs me she has clearly had an MRI scan of the brain  safely in 2003, and it will perhaps be safe to do the MRI.  I explained  to the patient and family that  the radiologist is concerned about  heating up the pacer leads and since one of her lead is in the  epicardium, this may potentially represent some danger of cardiac  arrhythmias with the patient.  They understand this and are willing to  get the MRI.  I would recommend we continue anticoagulation with  Coumadin with target INR of 3.5-4.5.  We will check carotid Dopplers, 2-  D echocardiogram, fasting lipid profile, hemoglobin A1c, and  homocysteine.  Physical and Occupational therapy consults.  I will be  happy to follow the patient in consult.  Kindly call for questions.           ______________________________  Sunny Schlein. Pearlean Brownie, MD     PPS/MEDQ  D:  06/09/2008  T:  06/10/2008  Job:  [id number]

## 2011-04-05 NOTE — Assessment & Plan Note (Signed)
[facility]                            CARDIOLOGY OFFICE NOTE   FLETA, BORGESON                      MRN:          [id number]  DATE:08/11/2008                            DOB:          14-Oct-1924    Doris [name] was seen in followup with [facility] Cardiology Office on  August 11, 2008.  She is a delightful 77 year old woman with a Bjork-  Shiley mitral valve and chronic atrial fibrillation.  She was  hospitalized in July for symptoms consistent with a stroke.  She  developed acute onset of gait instability and dizziness as well as some  weakness in her left leg.  She denied speech or vision complaints.  Her  symptoms have improved since hospitalization, but she continues to have  some problems with her gait.  Interestingly, she underwent an MRI and  MRA during the hospitalization which were essentially unremarkable.  They did not show acute or subacute stroke.   She denies chest pain, dyspnea, edema, or other cardiac complaints.  She  has had some difficulty regulating her INR, but our anticoagulation  clinic has aired on the side of supratherapeutic INR due to concerns  over her ischemic stroke risk.   MEDICATIONS:  1. Coumadin as directed.  2. Aspirin 81 mg daily.  3. Multivitamin daily.  4. Furosemide 40 mg daily.  5. Verapamil 180 mg one and a half daily.   ALLERGIES:  TYLOX.   PHYSICAL EXAMINATION:  GENERAL:  The patient is alert and oriented.  She  is a thin age-appropriate woman, in no acute distress.  VITAL SIGNS:  Weight 112 pounds, blood pressure 118/65, heart rate 73,  respiratory rate 16.  HEENT:  Normal.  NECK:  Normal carotid upstrokes.  No bruits.  JVP normal.  LUNGS:  Clear bilaterally.  HEART:  Regular rate and rhythm with a crisp mechanical S1.  There are  no murmurs or gallops.  ABDOMEN:  Soft, nontender.  No organomegaly.  EXTREMITIES:  No clubbing, cyanosis, or edema.  Peripheral pulses are  intact and equal.   EKG shows atrial fibrillation with lateral ST and T-wave abnormality.  Unchanged from previous tracing.   ASSESSMENT:  1. Mitral valve replacement with Bjork-Shiley mitral valve.  Continue      aggressive anticoagulation with Coumadin and aspirin.  The      patient's INR should be 3.5-4.5 and probably closer to the 4-4.5      range if possible.  While her MRI findings were negative, her      symptoms are strongly suspicious for stroke.  2. Chronic atrial fibrillation.  As above, continue with      anticoagulation.  Rate is well-controlled with verapamil.   For followup, I would like to see [name] in 6 months.  This a  difficult clinical situation and I asked her to be extremely careful  with regard to her gait instability in the setting of a need for  aggressive anticoagulation with Coumadin and aspirin.     Veverly Fells. Excell Seltzer, MD  Electronically Signed    MDC/MedQ  DD: 08/11/2008  DT: 08/12/2008  Job #: [id number]

## 2011-04-05 NOTE — Assessment & Plan Note (Signed)
[facility]                            CARDIOLOGY OFFICE NOTE   Doris [name]                      MRN:          [id number]  DATE:08/20/2007                            DOB:          July 19, 1924    Doris [name] was seen in followup at the [facility] Cardiology office on  August 20, 2007.  She is a delightful 77 year old woman who had a  Bjork-Shiley mitral prosthesis 27 years ago.  She has chronic atrial  fibrillation and is anticoagulated with her prosthetic valve and chronic  AF.  She is a long time patient of Joe Norvelt's, and I will be assuming  her care from here forward.   Doris [name] continues to do remarkably well from a symptomatic  standpoint.  She remains active and has no cardiac symptoms.  She denies  chest pain, dyspnea, palpitations, orthopnea, PND, or edema.  She did  notice a transient episode of left eye blurry vision that lasted  approximately 20 seconds.  The episode occurred 3 weeks ago.  She has  had no further visual symptoms.  She had similar episodes in the past,  albeit on rare occasion.   MEDICATIONS:  1. Coumadin as directed.  2. Aspirin 81 mg daily.  3. Multivitamin daily.  4. Furosemide 40 mg daily.  5. Verapamil 180 mg 1-1/2 daily.   ALLERGIES:  TYLOX and TOPICAL NEOMYCIN.   PHYSICAL EXAMINATION:  She is a alert and oriented in no acute distress.  Weight is 114 pounds, blood pressure is 116/60, heart rate 75,  respiratory rate is 16.  HEENT:  Normal.  NECK:  Normal carotid upstrokes without bruits.  Jugular venous pressure  is normal.  LUNGS:  Clear to auscultation bilaterally.  HEART:  Regular rate and rhythm with a crisp mechanical S1, no murmurs  or gallops.  ABDOMEN:  Soft, nontender, no organomegaly, no abdominal bruits.  EXTREMITIES:  No cyanosis, clubbing, or edema.   Echocardiogram from April 28 of this year showed normal left ventricular  size and systolic function with an left ventricular  ejection fraction of  55%.  There were no regional wall motion abnormalities.  The mitral  valve prosthesis demonstrated normal function with the exception of  trivial perivalvular mitral regurgitation.  There was marked biatrial  enlargement.   EKG shows atrial fibrillation with a lateral ST segment and T-wave  abnormality consistent with hypertrophy versus dig effect.  The QT  interval is mildly prolonged.   ASSESSMENT:  1. Chronic atrial fibrillation.  Maintaining well on Coumadin.      Ventricular rate is well controlled with verapamil.  2. Mechanical mitral prosthesis.  Continue Coumadin and aspirin.  3. Transient monocular blurry vision.  This is always concerning in a      patient with mechanical prosthesis.  At this point, we will      continue with Coumadin and aspirin.  The patient's valve function      is normal by echocardiography, and her exam is in concordance with      normal valve function.  If she has any further symptoms, she was  advised to contact us immediately.   For followup, I will plan on seeing Doris [name] back in 6 months or  sooner if any new problems arise.     Veverly Fells. Excell Seltzer, MD  Electronically Signed    MDC/MedQ  DD: 08/22/2007  DT: 08/23/2007  Job #: [id number]   cc:   Jethro Bastos, M.D.

## 2011-04-08 NOTE — Consult Note (Signed)
NAME:  Doris [name], Doris [name] NO.:  [id number]   MEDICAL RECORD NO.:  [id number]                   PATIENT TYPE:  INP   LOCATION:  3712                                 FACILITY:  [facility]   PHYSICIAN:  Willa Rough, MD [facility]                DATE OF BIRTH:  1924/04/25   DATE OF CONSULTATION:  09/19/2002  DATE OF DISCHARGE:                                   CONSULTATION   REASON FOR CONSULTATION:  This delightful 77 year old female patient needs  to have a cholecystectomy.  Her cardiac status has been followed by Dr. Cecil Cranker.  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  Hemodynamically, she has done very well over the  years.  It is my understanding that she has had an embolic CVA, although I  am not sure that it has had any significant sequelae.  Anticoagulation is  extremely important for her.   Cholecystectomy is scheduled over the next few days.  She is now admitted  for careful initiation of heparin as her Coumadin is stopped to carefully  keep her anticoagulated.   As mentioned, she has no chest pain or dyspnea.  There is no edema.   ALLERGIES:  TYLOX and TOPICAL NEOMYCIN.   MEDICATIONS:  1. Lanoxin 0.125 mg q.d.  2. Verapamil 180 mg q.d.  3. Multivitamin.  4. Baby aspirin 81 mg q.d.  5. Coumadin 4 mg q.d. except 2 mg on Sunday.  6. Lipitor 10 mg q.d.   PAST MEDICAL HISTORY:  See the complete list of problems from the past in  the final problem list at the end of this dictation.   SOCIAL HISTORY:  The patient lives alone in [place].  She is widowed and  retired.  She does not smoke.  She does not drink alcohol.   FAMILY HISTORY:  There is no family history in either parent for coronary  artery disease.   REVIEW OF SYMPTOMS:  She has had no fevers or chills.  HEENT:  Reveals no  problems.  SKIN:  She has had no skin rashes.  RESPIRATORY:  There has been  no shortness of breath or chest pain.   GU/GI:  She is having no GU or GI  symptoms.  MUSCULOSKELETAL/NEUROLOGIC:  She has no musculoskeletal or  neurologic symptoms.  The remainder of the review of systems is negative.   PHYSICAL EXAMINATION:  VITAL SIGNS:  Blood pressure 110/64, temperature 97.9  with a pulse of 62, and respirations are 20.  GENERAL:  She is quite stable.  HEENT:  There are no obvious significant problems.  NECK:  Soft.  There are no bruits.  SKIN:  There are no obvious skin lesions.  CARDIOVASCULAR:  S1.  This is a crisp closure sound from her mechanical  prosthesis.  There is a normal S2 and I believe I can hear  a soft opening  click of her Bjork-Shiley valve.  There is no obvious mitral regurgitation  heard.  LUNGS:  Clear.  ABDOMEN:  Soft with normal bowel sounds.  GENITOURINARY:  Deferred.  RECTAL:  Deferred.  EXTREMITIES:  No obvious edema.  NEUROLOGICAL:  She is grossly intact.   LABORATORY DATA:  Chest x-ray is pending and EKG is still pending.  Labs are  reviewed in the chart.  She does have a hemoglobin of 13.7 with platelets of  233,000.  Potassium is 3.9 with a BUN of 15, and a creatinine of 0.7.  Her  INR is 1.9 and heparin has now been started.   IMPRESSION:  1. Need for cholecystectomy and the patient has now had Coumadin stopped and     heparin started and we will help oversee this.  2. History of rheumatic heart disease with a mechanical valve replacement     with a Bjork-Shiley valve in 1981.  Fortunately, she has done extremely     well with this over time.  3. Long-term Coumadin therapy.  4. History of chronic atrial fibrillation with a controlled ventricular     response.  5. History of rheumatic heart disease.  6. History of a cerebrovascular accident secondary to a embolus at some time     in the past but no obvious significant residual.  7. History of hypertension, controlled.  8. History of good left ventricular function.  9. History of elevated cholesterol, treated.  10.       History of esophageal dilatation.  11.      History of normal coronaries at the time of catheterization in the     past.   PLAN:  The patient will be watched very carefully from the anticoagulation  view point.  Surgery will be scheduled when her INR is low enough and Dr.  Sharlet Salina T. Hoxworth can proceed with the operation.                                               Willa Rough, MD [facility]    JK/MEDQ  D:  09/19/2002  T:  09/19/2002  Job:  [id number]   cc:   Lorne Skeens. Hoxworth, M.D.  Fax: [phone]

## 2011-04-08 NOTE — Assessment & Plan Note (Signed)
[facility]                 COUMADIN / CHRONIC HEART FAILURE CLINIC NOTE   BRYNLEY, CUDDEBACK                      MRN:          [id number]  DATE:02/13/2007                            DOB:          22-Mar-1924    I received a telephone call message from the patient, Doris [name]  regarding recent dental work, and I returned the call to the patient at  3:54 today.  [name] states that she underwent dental cleaning  yesterday that did require a fair amount of probing underneath her gums.  She stated that she had some bleeding yesterday.  She awoke this morning  and had a mouthful of blood, which was not quantified in any way other  than that.  She states that she has had dripping blood coming from her  mouth from the examination sites throughout the day today.  The patient  had an INR on March 25 that was 3.4, slightly below her target goal.  She has had no other symptoms associated with her bleeding.  I have  offered to see her today.  I have offered alternately for her to come in  for an INR check in the morning.  She has declined to come in today,  instead plans to go to see her dentist for a topical solution to treat  the bleeding.  I have also suggested that she can take 2 mg today,  though I do not know what her INR is, and she is cognizant of this.  Therefore, she has decided that she will come in at 10:15 tomorrow  morning.  She will call the dentist or [place] with further bleeding problems  and come in accordingly.      Shelby Dubin, PharmD, BCPS, CPP  Electronically Signed      Bevelyn Buckles. Bensimhon, MD  Electronically Signed   MP/MedQ  DD: 02/21/2007  DT: 02/21/2007  Job #: [id number]

## 2011-04-08 NOTE — H&P (Signed)
Doris [name], Doris [name] NO.:  [id number]   MEDICAL RECORD NO.:  [id number]          PATIENT TYPE:  INP   LOCATION:  3715                         FACILITY:  [facility]   PHYSICIAN:  Doris [name], M.D. LHCDATE OF BIRTH:  [birth year]   DATE OF ADMISSION:  09/09/2004  DATE OF DISCHARGE:                                HISTORY & PHYSICAL   PRIMARY CARE PHYSICIAN:  Dr. Jonny [name].   CARDIOLOGIST:  Dr. Corinda [name].   CHIEF COMPLAINT:  Recent shortness of breath.   HISTORY OF PRESENT ILLNESS:  Doris [name] is a pleasant 77 year old woman  with a history of mitral valve disease, status post Bjork-Shiley prosthesis  at a facility in [place] back in 1981 (rheumatic heart disease), permanent  atrial fibrillation, dyslipidemia, and previous embolic stroke.  She was  last seen in the office in August by Dr. Corinda [name].  She is now admitted to  the hospital with the recent onset of breathlessness, relative increase in  heart rate, and a tightness around the epigastric area to lower costal  area.  She states that this began yesterday and has been episodic since  then, also associated with orthopnea.  She has had no fevers or chills and  denies any cough or hemoptysis.  She does mention that over this past  weekend, she apparently was noting some memory problems and mild confusion.  She has had her Coumadin adjusted recently, and tells me that it was  subtherapeutic this past Monday, although today her INR is 3.7.  She called  the office and was referred to the emergency department.  She is now  admitted to the hospital and is breathing comfortably at rest, although she  does get somewhat breathless when she speaks in long sentences.   ALLERGIES:  Tylox and topical neomycin.   MEDICATIONS:  1.  Coumadin 2 mg on Monday through Wednesday with 4 mg on the remaining      days of the week.  2.  Verapamil 180 mg p.o. daily.  3.  Vytorin 10/40 mg, one p.o. daily.  4.  Multivitamin, one p.o.  daily.  5.  Aspirin 81 mg p.o. daily.   PAST MEDICAL HISTORY:  1.  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  2.  Apparently normal coronary arteries by remote cardiac catheterization.      The patient did undergo an adenosine Cardiolite back in February of this      year which showed no evidence of ischemia or infarction with an ejection      fraction of 67%.  3.  Dyslipidemia.  4.  History of embolic stroke.  5.  History of trigeminal neuralgia.  6.  Prior history of esophageal dilatation.  7.  Status post laparoscopic cholecystectomy.  8.  Recently diagnosed petechiae involving the lower extremities and upper      extremities, apparently in a setting of normal platelet count.  I do not      have all the specifics, but apparently this was felt to be due to  a      vasculitis, and the patient is being followed expectantly at this time.   SOCIAL HISTORY:  The patient is a widow and lives in [place].  She has a  35-pack a year history of tobacco, but quit 24 years ago.  She denies any  alcohol use.   FAMILY HISTORY:  Noncontributory at present.   REVIEW OF SYSTEMS:  As described in history of present illness.  She has  chronic right-sided hearing loss following surgery for her trigeminal  neuralgia.  She has had petechiae as described in the history of present  illness that have been episodic over the last few weeks on the legs and  arms.  She has had no fevers, chills, cough, hemoptysis, melena,  hematochezia, peripheral edema or syncope.   PHYSICAL EXAMINATION:  VITAL SIGNS:  Temperature is 97.8 degrees.  Heart  rate is 90 to 106, in atrial fibrillation.  Respirations 18. Blood pressure  145/81.  Oxygen saturation is 97% on room air.  GENERAL:  This is an elderly woman, seated, in no acute distress.  HEENT:  Conjunctivae and lids normal.  Oropharynx is clear.  NECK:  Supple without elevated jugular  venous pressure or loud carotid  bruits.  No thyromegaly is noted.  LUNGS:  Diminished breath sounds, but no rales, wheezing or rhonchi.  CARDIAC:  Exam reveals and irregularly irregular rhythm with a crisp,  mechanical sound and S1, followed by a soft systolic murmur, and no S3  gallop or pericardial rub.  ABDOMEN:  Soft, nontender with normoactive bowel sounds.  EXTREMITIES:  Showed no pitting edema or active petechiae at this time.  PULSES:  Peripheral pulses are 2+.  MUSCULOSKELETAL:  No kyphosis noted.  NEUROLOGIC/PSYCHIATRIC:  The patient is alert x3.   LABORATORY DATA:  Recent laboratory data showed a normal urinalysis.  Point  of care troponin I of less than 0.05, BUN 14, creatinine 1.0.  Sodium 140,  potassium 4.2, hemoglobin 13.9.  INR 3.7.  D-Dimer 0.26.   Head CT is reported as showing a right occipital craniotomy with overall  mild atrophy and small-vessel ischemic changes, but no acute or focal  intracranial abnormalities noted.   Chest x-ray is reported as showing cardiomegaly with a prosthetic mitral  valve in place with no definite edema.  There are calcified granulomata in  the right hilum, but no acute process is noted.   A 12-lead electrocardiogram shows atrial fibrillation at 94 beats per minute  with diffuse ST-T wave changes, with ST segment depression in the  inferolateral leads of less than 1 mm and generalized T wave flattening.  There is no old tracing for comparison at this point.   IMPRESSION:  1.  Recent onset of shortness of breath as outlined with orthopnea and a      sensation of relatively increased heart rate at rest.  She has also had      some tightness in her lower costal area anteriorly.  Initial point of      care cardiac markers are negative, and her chest x-ray shows no active      pulmonary edema.  She did have a Cardiolite back in February of this     year that was negative for ischemia with a normal ejection fraction of      67%.  At  present, her INR is therapeutic at 3.7, but apparently was low      earlier in the week.  She did have some relative memory  difficulties and      confusion this past weekend.  2.  No documented history of coronary artery disease or previous myocardial      infarction.  3.  Dyslipidemia, on statin therapy.  4.  Apparent history of recently diagnosed vasculitis with petechiae that      have been episodic.   PLAN:  1.  The patient is admitted to telemetry.  We will continue to cycle cardiac      markers.  2.  We will continue home medications including Coumadin for the time being.  3.  Check erythrocyte sedimentation rate.  4.  We will follow up with a 2-D echocardiogram to assess left ventricular      performance and status of the mitral valve prosthesis.  A BNP level will      also be obtained.  5.  Once information is available, we can make further plans.       SGM/MEDQ  D:  09/09/2004  T:  09/09/2004  Job:  [id number]   cc:   Cecil Cranker, M.D.   Corwin Levins, M.D. [facility]

## 2011-04-08 NOTE — Discharge Summary (Signed)
Doris [name], Doris [name]                           ACCOUNT NO.:  [id number]   MEDICAL RECORD NO.:  [id number]                    PATIENT TYPE:  INP   LOCATION:                                       FACILITY:  [facility]   PHYSICIAN:  Sharlet Salina T. Hoxworth, M.D.          DATE OF BIRTH:  1924/11/17   DATE OF ADMISSION:  09/19/2001  DATE OF DISCHARGE:  09/29/2001                                 DISCHARGE SUMMARY   DISCHARGE DIAGNOSIS:  Symptomatic cholelithiasis.   OPERATIONS AND PROCEDURES:  Laparoscopic cholecystectomy with intraoperative  cholangiogram on September 23, 2002.   HISTORY OF PRESENT ILLNESS:  The patient is a 77 year old white female who  presented in June of this year with recurrent episodes of abdominal pain;  these all occurred at night and are described as an aching discomfort in her  mid-abdomen associated with nausea but no vomiting.  These would last for  several hours and go away.  No fever, chills or jaundice.  Her symptoms have  been persistent over several months.  She had a thorough GI workup under the  direction of Dr. Wilhemina Bonito. [name], including a colonoscopy with findings of  just a diminutive polyp and CT scan of the abdomen and pelvis that was  negative, a GYN evaluation and ultrasound that were negative, an abdominal  ultrasound which has revealed a solitary gallstone.  Common bile duct was  normal.  She has had various attempts at symptomatic management, all with no  improvement.  She was felt to have pain consistent with biliary colic, with  no other identifiable cause, and is admitted preoperatively for planned  laparoscopic cholecystectomy for management of her anticoagulation.   PAST MEDICAL HISTORY:  Past medical history is significant for mitral valve  replacement in 1981 with mechanical valve.  She had brain stem surgery for  trigeminal neuralgia in 2000.  She has had an anal fistulotomy in the past.  She is followed for reflux esophagitis and has had  esophageal dilatations.  She has chronic atrial fibrillation.   CURRENT MEDICATIONS:  1. Lipitor 10 mg daily.  2. Verapamil 180 mg daily.  3. Coumadin 6 mg a day.  4. Lanoxin 0.125 mg daily.   ALLERGIES:  TYLOX and TOPICAL NEOMYCIN.   SOCIAL HISTORY, FAMILY AND REVIEW OF SYSTEMS:  See detailed H&P.   PERTINENT PHYSICAL EXAMINATION:  She was afebrile and vital signs were  within normal limits.  In general, she is a healthy-appearing white female  in no acute distress.  Chest exam revealed a systolic murmur and crisp valve  click.  Abdomen revealed some mild tenderness in the epigastrium and across  her lower abdomen as well.  No palpable masses or hepatosplenomegaly.   HOSPITAL COURSE:  The patient was admitted on September 19, 2002, after being  off her Coumadin for two days, and IV heparin was begun under pharmacy  protocol.  She was  seen in consultation by cardiology as well  perioperatively.  Her INR decreased to normal range and on September 23, 2002,  she underwent an uneventful laparoscopic cholecystectomy.  Postoperatively,  her heparin was resumed 24 hours after surgery.  She had no complications  from the surgery, with minimal soreness and no evidence of bleeding.  The  remainder of her hospitalization was for IV heparin therapy while her INR  became therapeutic.  She had no postoperative complications.  She was  discharged home on September 29, 2002.  At this time, her Coumadin dose is 7.5  mg daily and she is to follow up in the [facility] and in my  office in two to three weeks.                                               Lorne Skeens. Hoxworth, M.D.    Tory Emerald  D:  11/07/2002  T:  11/09/2002  Job:  [id number]

## 2011-04-08 NOTE — Letter (Signed)
August 23, 2006     Oley Balm. Georgina Pillion, M.D.  [address]  [place], [place] [address]   RE:  Doris [name]  MRN:  [id number]  /  DOB:  1924/05/24   Dear Onalee Hua:   Doris [name] is her for followup on August 23, 2006.  As you know,  [name] is a very pleasant 77 year old white female, 25 years post mitral  valve replacement with a Bjork-Shiley prosthesis.  She has chronic atrial  fibrillation.   The patient had been getting along quite well; however, she has had 1  episode of volume overload with normal LV function in October 2005.   She has a history of irritable bowel syndrome and she had surgery for  trigeminal neuralgia in the past and has had a couple of episodes of pain  recently, but not in the past month.   She has no noted palpitations or shortness of breath or chest pain.   MEDICATIONS:  1. Coumadin.  2. Aspirin 81.  3. Furosemide 40.  4. Verapamil 270 daily.   Patient has been unable to tolerate STATIN.   PHYSICAL EXAMINATION:  Blood pressure 118/95.  Pulse is 99.  Atrial  fibrillation.  GENERAL APPEARANCE:  Normal.  JVP not elevated; carotid pulses palpable and equal without bruits.  LUNGS:  Clear.  CARDIAC:  Normal valve sounds with no significant murmur.  ABDOMEN:  Normal.  EXTREMITIES:  Normal.   EKG reveals atrial fibrillation with ventricular rate of 99.  There is  evidence of ST-T abnormality.   IMPRESSION:  Diagnosis as above.  Her rate has increased since June when it  was 71 and  we may need to decrease her verapamil or add beta blocker.  1. I plan on seeing the patient back in 3 months.  We plan to get a Holter      monitor.    Sincerely,    ______________________________  E. Graceann Congress, MD, [facility]     EJL/MedQ  DD:  08/23/2006  DT:  08/24/2006  Job #:  [id number]

## 2011-04-08 NOTE — Letter (Signed)
December 08, 2006    Oley Balm. Georgina Pillion, M.D.  [address]  [place], [place] [address]   RE:  ALEXIS, REBER  MRN:  [id number]  /  DOB:  02/17/1924   Dear Onalee Hua:   It was a pleasure to see our mutual patient, Doris [name], for followup  on December 08, 2006.  As you know, she is a very pleasant 77 year old  white female, 26 years postop mitral valve replacement with a Bjork-  Shiley prosthesis.  The patient has chronic atrial fibrillation, mild  pulmonary hypertension, and has had an episode of volume overload in  October 2005 with normal LV function.  Mild perivalvular leak.  Patient  also has a history of surgery by Dr. Newell Coral for trigeminal neuralgia.   In general, she has gotten along quite well.  Has no cardiac symptoms at  this time.   Medications include:  1. Coumadin.  2. Aspirin 81.  3. Furosemide 40.  4. Verapamil 270.   Blood pressure 136/63.  Pulse 83.  Atrial fibrillation.  GENERAL APPEARANCE:  Normal.  JVP is not elevated.  Carotid pulses palpable without bruits.  LUNGS:  Clear.  CARDIAC EXAM:  Reveals normal valve sounds, atrial fibrillation, no  murmur.  ABDOMINAL EXAM:  Unremarkable.  EXTREMITIES:  Normal.   EKG reveals atrial fibrillation, ventricular rate of 83.  Some minor  nonspecific ST changes, question of old anterior septal MI.   DIAGNOSIS:  As above.  Patient is quite stable.  Suggest she continue on  the same therapy.  I will see her back in 3 or 4 months or p.r.n.  We  plan to check a basic metabolic profile today.  Thanks for the  opportunity to share in this nice patient's care.    Sincerely,      E. Graceann Congress, MD, [facility]  Electronically Signed    EJL/MedQ  DD: 12/08/2006  DT: 12/08/2006  Job #: [id number]

## 2011-04-08 NOTE — Discharge Summary (Signed)
Doris [name], Doris [name] NO.:  [id number]   MEDICAL RECORD NO.:  [id number]          PATIENT TYPE:  INP   LOCATION:  3715                         FACILITY:  [facility]   PHYSICIAN:  Pricilla Riffle, M.D.    DATE OF BIRTH:  August 11, 1924   DATE OF ADMISSION:  09/09/2004  DATE OF DISCHARGE:  09/10/2004                                 DISCHARGE SUMMARY   PROCEDURES:  1.  Two-dimensional echocardiogram.  2.  Cardiolite.   HOSPITAL COURSE:  Ms. Cressler is a 77 year old female with known history of  coronary artery disease.  She had a Bjork-Shiley mitral valve replacement in  1981.  She has permanent atrial fibrillation and dyslipidemia.  She also has  a history of an embolic stroke.  She came to the emergency room because of  shortness of breath, relative increase in her heart rate and some chest  tightness.  She was admitted for further evaluation and treatment.   Her enzymes were negative for MI and her other labs were within normal  limits, except for a BNP that was elevated at 305.  A urinalysis was  performed and this was negative as well.  A TSH was within normal limits at  1.908.  A lipid profile was performed and showed a total cholesterol of 163,  triglycerides 45, HDL 76, LDL 78.  She was evaluated by Dr. Olga Millers  and he felt that the dyspnea she had had was possibly related to mild volume  overload.  He gave her a dose of IV Lasix and this improved her symptoms.  She had a dose of Lasix today and is to start Lasix 20 mg p.o. every other  day.  Her INR was checked while she was here and it was therapeutic at 4.2.  Her therapeutic range is 3.5 to 4.0 because of her CVA.   In addition to the labs, a 2-D echocardiogram was performed.  It showed an  EF of 55% to 65% with no regional wall motion.  She had no significant  aortic valvular abnormalities.  The Bjork-Shiley mechanical mitral valve  prosthesis was well-seated with trivial mitral valvular regurgitation  and a  mean transmittal gradient of 4.2 mmHg.  The mitral valve area by pressure  half-time was 3.12 sq cm.  The left atrium was moderately to markedly  dilated and her PAS was borderline-elevated.  The right atrium was  moderately to markedly dilated as well.  The mitral valve was not that well-  visualized, but Dr. Jens Som felt that since her symptoms had improved and  her cardiac enzymes were negative, she could be discharged and have  outpatient followup.   DISCHARGE DIAGNOSES:  1.  Shortness of breath, possible mild volume overload with an elevated BNP.  2.  Status post Bjork-Shiley prosthetic mitral valve.  3.  Allergy to Tylox and topical Neomycin.  4.  Chronic anticoagulation with Coumadin.  5.  Chronic atrial fibrillation.  6.  Dyslipidemia.  7.  History of embolic cerebrovascular accident.  8.  History of trigeminal neuralgia.  9.  History of esophageal stricture and dilatation.  10. Status post laparoscopic cholecystectomy.  11. History of possible vasculitis, followed by primary care.  12. History of spontaneous left thigh hematoma, resolved.   DISCHARGE INSTRUCTIONS:  1.  Her activity level is to be as tolerated.  2.  She is to stick to a low-salt, low-fat diet.  3.  She was advised to weigh herself daily and report 3- to 5-pound gains.  4.  She was also advised that she needs to come in next week for a BMET and      a pro time.  5.  She is also to follow up with Dr. Cecil Cranker in 1-2 weeks.  He is      to determine if further testing is warranted.   DISCHARGE MEDICATIONS:  1.  Coumadin 2 mg on Monday and Wednesday, with 4 mg the other days of the      week.  2.  Verapamil 180 mg p.o. daily.  3.  Vytorin 10/40 mg p.o. daily.  4.  Multivitamin daily.  5.  Aspirin 81 mg p.o. daily.  6.  Lasix 20 mg every other day.   SPECIAL DISCHARGE INSTRUCTIONS:  The patient was advised that it would be a  good idea to get a blood pressure machine so that she can make  sure she does  not get hypotensive with the addition of a diuretic to her medication  regimen.       RB/MEDQ  D:  09/10/2004  T:  09/11/2004  Job:  [id number]   cc:   Corwin Levins, M.D. [facility]   Bea Laura Graceann Congress, M.D.

## 2011-04-08 NOTE — Op Note (Signed)
NAME:  FRANKIE, ZITO                         ACCOUNT NO.:  [id number]   MEDICAL RECORD NO.:  [id number]                   PATIENT TYPE:  INP   LOCATION:  3712                                 FACILITY:  [facility]   PHYSICIAN:  Sharlet Salina T. Hoxworth, M.D.          DATE OF BIRTH:  1924/06/14   DATE OF PROCEDURE:  09/23/2002  DATE OF DISCHARGE:                                 OPERATIVE REPORT   PREOPERATIVE DIAGNOSIS:  Symptomatic cholelithiasis.   POSTOPERATIVE DIAGNOSIS:  Symptomatic cholelithiasis.   PROCEDURE:  Laparoscopic cholecystectomy with intraoperative cholangiogram.   SURGEON:  Sharlet Salina T. Hoxworth, M.D.   ASSISTANTAngelia Mould. Derrell Lolling, M.D.   ANESTHESIA:  General.   BRIEF HISTORY:  The patient is a 77 year old white female with repeated  episodes of midabdominal pain.  She has had an extensive negative workup  except for her gallbladder ultrasound, which shows a single small gallstone.  She was felt to likely have symptomatic cholelithiasis, and laparoscopic  cholecystectomy has been recommended and accepted.  The nature of the  procedure, its indications, the risks of bleeding, infection, bile leak,  bile duct injury, were discussed and understood preoperatively.  The patient  was chronically anticoagulated and has been admitted preoperatively for  heparin therapy.  Her heparin was stopped 12 hours prior to the surgery.  She received preoperative antibiotics.  PAS were in place.  She was brought  to the operating room for this procedure.   DESCRIPTION OF PROCEDURE:  The patient was brought to the operating room and  placed in the supine position on the operating table, and general  endotracheal anesthesia was induced.  An orogastric tube was placed.  The  abdomen was sterilely prepped and draped.  Local anesthesia was used to  infiltrate the trocar sites prior to the incisions.  A 1 cm incision was  made at the umbilicus and dissection carried down to the midline  fascia,  which was sharply incised for 1 cm and the peritoneum entered under direct  vision.  Through a mattress suture of 0 Vicryl, the Hasson trocar was placed  and pneumoperitoneum established. Under direct vision a 10 mm trocar was  placed in the subxiphoid area and two 5 mm trocars along the right subcostal  margin.  The gallbladder was visualized and grossly appeared normal.  The  fundus was grasped and elevated up over the liver and the infundibulum  retracted inferolaterally.  Fibrofatty tissue was stripped off the neck of  the gallbladder toward the porta hepatis.  The distal gallbladder and  Calot's triangle was thoroughly dissected.  The cystic duct-gallbladder  junction was identified and the cystic duct dissected over about a  centimeter and the cystic duct-gallbladder junction dissected 360 degrees.  The cystic artery was identified in Calot's triangle.  When the anatomy was  cleared, the cystic duct was clipped at the gallbladder junction and  operative cholangiogram was obtained through the cystic duct.  This showed  good filling of a normal common bile duct and intrahepatic ducts with free  flow into the duodenum and no filling defects.  Following this, the  cholangiocatheter was removed and the cystic duct triply clipped proximally  and divided.  The cystic artery was doubly clipped proximally, clipped  distally, and divided.  The gallbladder was dissected from its bed using  hook cautery and removed intact through the umbilicus.  The right upper  quadrant was irrigated and complete hemostasis assured.  Trocars removed  under direct vision and all CO2 evacuated from the peritoneal  cavity.  The mattress suture was secured at the umbilicus.  Skin incisions  were closed with interrupted subcuticular 4-0 nylon and Steri-Strips.  Sponge, needle, and instrument counts were correct.  Dry sterile dressings  were applied and the patient taken to the recovery room in good  condition.                                               Lorne Skeens. Hoxworth, M.D.    Tory Emerald  D:  09/23/2002  T:  09/23/2002  Job:  [id number]

## 2011-04-08 NOTE — Letter (Signed)
March 06, 2007    Oley Balm. Georgina Pillion, M.D.  [address]  [place], [place] [address]   RE:  EZRA, MARQUESS  MRN:  [id number]  /  DOB:  10/24/1924   Dear Onalee Hua:   It was a pleasure to see our mutual patient, Doris [name] for followup  on March 06, 2007.  As you know, she is a very pleasant 77 year old  white female 27 years postoperative mitral valve replacement with a  Bjork-Shiley prosthesis.   The patient has chronic atrial fibrillation and mild pulmonary  hypertension, and had an episode of volume overload in October 2005 with  normal left ventricular function.  She had mild perivalvular leak.   She also has a history of surgery by Dr. Newell Coral for trigeminal  neuralgia.   The patient continued to get along quite well with no cardiac symptoms.   MEDICATIONS:  1. Coumadin.  2. Aspirin 81.  3. Furosemide 40.  4. Verapamil 270 daily.   EXAM:  Blood pressure is 124/66, pulse 57 and atrial fibrillation.  GENERAL APPEARANCE:  Normal.  JVPs not elevated. Carotid pulses palpable and equal without bruits.  LUNGS:  Clear.  CARDIAC:  Normal valve sounds with atrial fibrillation.  Controlled  ventricular rate.  ABDOMEN:  Exam reveals a palpable aorta.  She has had previous abdominal  ultrasound that revealed no aneurysm.  EXTREMITIES:  Normal.   IMPRESSION:  Diagnoses as above.  EKG reveals atrial fibrillation with  controlled ventricular response and minor nonspecific ST-T wave changes,  questionable anteroseptal myocardial infarction.   I think the patient is getting along quite well.  Suggest she continue  on the same therapy, and I will have her see Dr. Tonny Bollman for  followup in 4 months.  We will plan to get the BMP and 2D echo prior to  that time.  Thank you for the opportunity to share in this nice  patient's care.    Sincerely,      E. Graceann Congress, MD, [facility]  Electronically Signed    EJL/MedQ  DD: 03/06/2007  DT: 03/06/2007  Job #:  [id number]

## 2011-04-12 ENCOUNTER — Other Ambulatory Visit: Payer: Self-pay | Admitting: Internal Medicine

## 2011-04-15 ENCOUNTER — Telehealth: Payer: Self-pay | Admitting: *Deleted

## 2011-04-15 NOTE — Telephone Encounter (Signed)
Fax from [facility] [phone], Alprazolam 0.25 mg QTY30 SIG take one tablet at bedtime as needed last dispensed 12/01/2010. Please advise refills

## 2011-04-15 NOTE — Telephone Encounter (Signed)
Called in refills for alprzolam

## 2011-04-19 ENCOUNTER — Telehealth: Payer: Self-pay | Admitting: *Deleted

## 2011-04-19 ENCOUNTER — Telehealth: Payer: Self-pay | Admitting: Cardiovascular Disease

## 2011-04-19 DIAGNOSIS — R609 Edema, unspecified: Secondary | ICD-10-CM

## 2011-04-19 MED ORDER — FUROSEMIDE 40 MG PO TABS: ORAL_TABLET | ORAL | Status: AC

## 2011-04-19 MED ORDER — ALPRAZOLAM 0.25 MG PO TABS: 0.2500 mg | ORAL_TABLET | Freq: Every evening | ORAL | Status: AC | PRN

## 2011-04-19 NOTE — Telephone Encounter (Signed)
Per Dr Excell Seltzer he would like the pt to have a Left leg venous duplex.  The pt can also continue to take Furosemide 80mg  daily for 2 more days and then decrease back to 40mg  daily. Pt aware of instructions.  I attempted to have the pt come into the office today for a duplex but she is waiting on a plumber to arrive at her home.  Pt scheduled for LEV duplex on 04/20/11 at 11:30. New Furosemide Rx sent into the pt's pharmacy.

## 2011-04-19 NOTE — Telephone Encounter (Signed)
Per pt calling this am with C/O swelling in ankles & legs. No chest pain, no sob.

## 2011-04-19 NOTE — Telephone Encounter (Signed)
I spoke with the pt and she complains of swelling in the her left ankle that started Friday night.  The pt's swelling is now from the knee down.  The pt does have some soreness in the left leg and she said her ankle is discolored at ankle "splotchy". The pt normally takes Furosemide 40mg  daily but on Monday she took 80mg .  The pt does have some improvement in her swelling today. I will discuss this pt with Dr Excell Seltzer.

## 2011-04-19 NOTE — Telephone Encounter (Signed)
Refill faxed in.  

## 2011-04-20 ENCOUNTER — Encounter (INDEPENDENT_AMBULATORY_CARE_PROVIDER_SITE_OTHER): Payer: Medicare Other | Admitting: *Deleted

## 2011-04-20 ENCOUNTER — Other Ambulatory Visit: Payer: Self-pay | Admitting: *Deleted

## 2011-04-20 DIAGNOSIS — M7989 Other specified soft tissue disorders: Secondary | ICD-10-CM

## 2011-04-20 DIAGNOSIS — M79609 Pain in unspecified limb: Secondary | ICD-10-CM

## 2011-04-20 DIAGNOSIS — R609 Edema, unspecified: Secondary | ICD-10-CM

## 2011-04-21 ENCOUNTER — Ambulatory Visit (INDEPENDENT_AMBULATORY_CARE_PROVIDER_SITE_OTHER): Payer: Self-pay | Admitting: Cardiovascular Disease

## 2011-04-21 ENCOUNTER — Other Ambulatory Visit (INDEPENDENT_AMBULATORY_CARE_PROVIDER_SITE_OTHER): Payer: Medicare Other

## 2011-04-21 ENCOUNTER — Encounter: Payer: Self-pay | Admitting: Internal Medicine

## 2011-04-21 ENCOUNTER — Ambulatory Visit (INDEPENDENT_AMBULATORY_CARE_PROVIDER_SITE_OTHER): Payer: Medicare Other | Admitting: Internal Medicine

## 2011-04-21 ENCOUNTER — Telehealth: Payer: Self-pay | Admitting: Cardiovascular Disease

## 2011-04-21 ENCOUNTER — Telehealth: Payer: Self-pay | Admitting: Internal Medicine

## 2011-04-21 VITALS — BP 120/70 | HR 78 | Temp 97.7°F | Wt 107.0 lb

## 2011-04-21 DIAGNOSIS — I4891 Unspecified atrial fibrillation: Secondary | ICD-10-CM

## 2011-04-21 DIAGNOSIS — R0989 Other specified symptoms and signs involving the circulatory and respiratory systems: Secondary | ICD-10-CM

## 2011-04-21 DIAGNOSIS — D6832 Hemorrhagic disorder due to extrinsic circulating anticoagulants: Secondary | ICD-10-CM

## 2011-04-21 DIAGNOSIS — Z7901 Long term (current) use of anticoagulants: Secondary | ICD-10-CM

## 2011-04-21 DIAGNOSIS — M7989 Other specified soft tissue disorders: Secondary | ICD-10-CM

## 2011-04-21 LAB — CBC
Hemoglobin: 13.2 g/dL (ref 12.0–15.0)
MCHC: 31.9 g/dL (ref 30.0–36.0)

## 2011-04-21 LAB — PROTIME-INR
INR: 6.1 ratio (ref 0.8–1.0)
Prothrombin Time: 56.7 s (ref 10.2–12.4)

## 2011-04-21 NOTE — Telephone Encounter (Signed)
Test results.  C/o foot is turning black.

## 2011-04-21 NOTE — Telephone Encounter (Signed)
Patient to hold coumadin until Monday - coag clinic for repeat INR Monday or Tuesday - please schedule an appointment

## 2011-04-21 NOTE — Telephone Encounter (Signed)
I spoke with the pt and made her aware that the 04/20/11 LEV (left leg) was negative for deep or superficial venous thrombus or incompetence.  The LEV did show a 1.8cm x 2.0 cm cyst with multiple striations just below the knee.  Today the pt is complaining of bruising on her left foot.  The pt denies any injury to her foot.  I instructed the pt to contact her PCP for further evaluation at this time.  Pt agreed with plan.

## 2011-04-21 NOTE — Telephone Encounter (Signed)
Ami - You have already confirmed pt's apt w/coag clinic and informed of results correct?

## 2011-04-22 ENCOUNTER — Encounter: Payer: Self-pay | Admitting: Internal Medicine

## 2011-04-22 ENCOUNTER — Encounter: Payer: Self-pay | Admitting: Cardiovascular Disease

## 2011-04-22 NOTE — Telephone Encounter (Signed)
If the coag clinic said to stay off of coumadin for two days only then she will need a protime today at the coag clinic or here so that we know before the weekend as to whether she should restart coumadin.

## 2011-04-22 NOTE — Telephone Encounter (Signed)
Spoke with pt and informed her that MD wanted her to stay off coumadin until Tuesday. She has appt with coumadin clinic on Wens June 6th she states she was advised by the coumadin clinc to stay off for only 2 days.

## 2011-04-22 NOTE — Progress Notes (Signed)
Subjective:    Patient ID: Doris [name], female    DOB: November 23, 1923, 77 y.o.   MRN: [id number]  HPI Patient is seen acutely for a swollen left leg. This started several days ago. She does not recall any precipitating event: no injury, no prolonged sitting. She had LE venous doppler May 30th at [facility] - no DVT, no Bakers cyst; positive for fluid collection in the upper calve extending into the gastroc. She has had continued swelling pain and has developed ecchymosis of the ankle and foot.  Past Medical History  Diagnosis Date  . Bacterial pneumonia     hx of  . Herpes zoster     hx of  . Gout   . CVA (cerebral vascular accident)   . Gastritis, acute     without mention of hemorrhage   . Trigeminal neuralgia   . Stricture and stenosis of esophagus   . Esophageal reflux   . Mitral valve disorders     hx of  . Unspecified peritonitis     hx of  . Acute rheumatic heart disease, unspecified     hx of  . Unspecified transient cerebral ischemia   . Atrial fibrillation   . Hx of mitral valve replacement   . Chronic diastolic heart failure   . Hyperplastic polyps of stomach   . Diverticulosis    Past Surgical History  Procedure Date  . Dilation and curettage of uterus 52  . Cholecystectomy 2003  . Mitral valve replacement 1981  . Esophageal dilation    Family History  Problem Relation Age of Onset  . Other Father 40    deceased  . Other Mother 100    deceased   History   Social History  . Marital Status: Widowed    Spouse Name: N/A    Number of Children: 1  . Years of Education: N/A   Occupational History  . 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 Smoking status: Former Games developer  . Smokeless tobacco: Never Used  . Alcohol Use: 0.0 oz/week     1 oz daily  . Drug Use: No  . Sexually Active: No   Other Topics Concern  . Not on file   Social History Narrative   [place] - [place]. Married '48- 50 yrs/Widowed. 1  daughter - '53, 1 son '50; 4 grandchildren, 4 greatgrandchildren. 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 Lives alone.  End-of-Life: no mechanical ventilation, no CPR.         Review of Systems Review of Systems  Constitutional:  Negative for fever, chills, activity change and unexpected weight change.  HENT:  Negative for hearing loss, ear pain, congestion, neck stiffness and postnasal drip.   Eyes: Negative for pain, discharge and visual disturbance.  Respiratory: Negative for chest tightness and wheezing.   Cardiovascular: Negative for chest pain and palpitations.       [No decreased exercise tolerance Gastrointestinal: [No change in bowel habit. No bloating or gas. No reflux or indigestion Genitourinary: Negative for urgency, frequency, flank pain and difficulty urinating.  Musculoskeletal: Negative for myalgias, back pain, arthralgias and gait problem.  Neurological: Negative for dizziness, tremors, weakness and headaches.  Hematological: Negative for adenopathy.  Psychiatric/Behavioral: Negative for behavioral problems and dysphoric mood.       Objective:   Physical Exam Vitals noted gen'l- WNWD white woman looking yonger than stated age in no distress Pul - no  increased WOB Cor - IRIR rate controlled Ext- left distal LE with swelling, + Homan's sign, tender to palpation of the calve, no rubor, ecchymosis of the ankle and foot.  Lab Results  Component Value Date   WBC 6.3 04/21/2011   HGB 13.2 04/21/2011   HCT 41.4 04/21/2011   PLT 237 04/21/2011   NA 139 06/23/2010   K 4.2 06/23/2010   CL 99 06/23/2010   CREATININE 0.7 06/23/2010   BUN 17 06/23/2010   CO2 29 06/23/2010   INR 6.1* 04/21/2011          Assessment & Plan:  1. Swelling of the left leg - no DVT. Appears to be a spontaneous hemorrhage into the gastronemius. Hgb is stable.  Plan - elevation of the leg  2. Coagulopathy - INR is too high. Lab Results  Component Value Date   INR 6.1* 04/21/2011    INR 3.9 04/05/2011   INR 4.4 03/22/2011   PROTIME 25.5 05/07/2009   PROTIME 20.2 05/01/2009   PROTIME 25.7 04/17/2009   Unexplained sudden rise in INR from the 15th of May.  Plan - hold coumadin until next Tuesday when she has appointment at coag clinic.

## 2011-04-22 NOTE — Telephone Encounter (Signed)
Pt states she feel confident in the coumadin clinics advisement on starting the coumadin back sat. She doesn't feel as though she needs to come in for labs today to have her PT INR checked

## 2011-04-23 ENCOUNTER — Emergency Department (HOSPITAL_COMMUNITY)
Admission: EM | Admit: 2011-04-23 | Discharge: 2011-04-23 | Disposition: A | Discharge status: Home / Self Care | Payer: Medicare Other | Attending: Emergency Medicine | Admitting: Emergency Medicine

## 2011-04-23 DIAGNOSIS — M79609 Pain in unspecified limb: Secondary | ICD-10-CM | POA: Insufficient documentation

## 2011-04-23 DIAGNOSIS — S8010XA Contusion of unspecified lower leg, initial encounter: Secondary | ICD-10-CM | POA: Insufficient documentation

## 2011-04-23 DIAGNOSIS — M7989 Other specified soft tissue disorders: Secondary | ICD-10-CM | POA: Insufficient documentation

## 2011-04-23 DIAGNOSIS — Z8673 Personal history of transient ischemic attack (TIA), and cerebral infarction without residual deficits: Secondary | ICD-10-CM | POA: Insufficient documentation

## 2011-04-23 DIAGNOSIS — X58XXXA Exposure to other specified factors, initial encounter: Secondary | ICD-10-CM | POA: Insufficient documentation

## 2011-04-23 LAB — DIFFERENTIAL
Basophils Absolute: 0.1 10*3/uL (ref 0.0–0.1)
Eosinophils Relative: 2 % (ref 0–5)
Lymphocytes Relative: 16 % (ref 12–46)
Lymphs Abs: 0.9 10*3/uL (ref 0.7–4.0)
Monocytes Absolute: 0.7 10*3/uL (ref 0.1–1.0)
Monocytes Relative: 13 % — ABNORMAL HIGH (ref 3–12)
Neutro Abs: 3.8 10*3/uL (ref 1.7–7.7)

## 2011-04-23 LAB — CBC
HCT: 39.3 % (ref 36.0–46.0)
Hemoglobin: 13.5 g/dL (ref 12.0–15.0)
MCH: 30.6 pg (ref 26.0–34.0)
MCHC: 34.4 g/dL (ref 30.0–36.0)
MCV: 89.1 fL (ref 78.0–100.0)
RDW: 14.2 % (ref 11.5–15.5)

## 2011-04-23 LAB — BASIC METABOLIC PANEL
BUN: 20 mg/dL (ref 6–23)
CO2: 30 mEq/L (ref 19–32)
Chloride: 98 mEq/L (ref 96–112)
Creatinine, Ser: 0.71 mg/dL (ref 0.4–1.2)
Glucose, Bld: 147 mg/dL — ABNORMAL HIGH (ref 70–99)
Potassium: 3.8 mEq/L (ref 3.5–5.1)

## 2011-04-24 NOTE — Consult Note (Signed)
[name], [name] NO.:  [id number]  MEDICAL RECORD NO.:  [id number]           PATIENT TYPE:  E  LOCATION:  [facility]                         FACILITY:  [facility]  PHYSICIAN:  Doris Post, MD    DATE OF BIRTH:  1923/11/26  DATE OF CONSULTATION: DATE OF DISCHARGE:  04/23/2011                                CONSULTATION   HISTORY:  Doris [name] is an 77 year old pleasant woman who I was asked to see to rule out compartment syndrome.  She has had left leg calf pain for the past 2 weeks.  This began spontaneously.  She had no trauma.  She reported increasing bruising.  She recently had an INR drawn which was greater than 6.  She is on Coumadin for the treatment of mitral valve replacement.  She has been on this for over 30 years.  She has no pain at rest, but has more pain with walking on that leg.  It is located directly over the left calf and then down towards the heel.  She has noted increasing bruising laterally as well.  She had no changes in her sensation.  PAST HISTORY:  Significant for mitral valve replacement.  FAMILY HISTORY:  Significant for a mother with heart disease, who lived almost until 64, and her father died of some type of trauma at age 19.  SOCIAL HISTORY:  She is a nonsmoker and lives at home.  REVIEW OF SYSTEMS:  Complete review of systems was performed and was otherwise negative with the exception of the musculoskeletal complaints as above.  PHYSICAL EXAMINATION:  CONSTITUTIONAL:  She is alert and oriented x3 in no acute distress and is well developed, well nourished. EYES:  Extraocular movements are intact. LYMPHATIC:  She has no cervical or axillary lymphadenopathy.  She is fairly thin and frail. CARDIOVASCULAR:  She does have a regular rate and rhythm and intact pulses throughout.  Her left lower extremity has pedal edema with bruising at around the level of the mid calf down towards the ankle with more substantial bruising around the  lateral calcaneus.  RESPIRATORY: She has no cyanosis and no increase in respiratory efforts. GI:  Her abdomen is soft, nontender with no organomegaly. PSYCHIATRIC:  Her judgment and insight are intact and she is competent for consent. SKIN:  She has no skin breaks over the left ankle.  Her skin is bruised as indicated before.  NEUROLOGIC:  Sensation is intact throughout the left leg.  All her toes flex, extend and her ankle dorsiflexion is intact.  She has no pain with passive motion. MUSCULOSKELETAL:  She has no pain to palpation over the distal fibula or over the tibia and no gross deformity.  She does have swelling on the left side, and no significant swelling on the right side.  Her left side has no pain to passive motion, she does have pain to palpation over her calf.  Vascular study apparently was done last week, which demonstrates no evidence for DVT.  IMPRESSION:  Spontaneous bleed secondary to Coumadin therapy in the left leg.  PLAN:  I do not see any evidence for compartment  syndrome.  I have recommended that she get consideration to compression stockings as well as elevation, and warm compresses, and she may have a discussion with Dr. Excell Seltzer regarding anticoagulation.  She apparently was instructed that her INR was to be somewhere between 3 and 4.  It may be that there may be other ranges that might be safer for her given her age; however, I will defer all of this decision making to Dr. Excell Seltzer.  There may also be other pharmaceutical options available at this point.  I am going to plan to see her back for her leg hematoma sometime in the next week or so a.  I would anticipate at least 4-6 weeks for resolution of all of her symptoms, and sometimes swelling and bruising takes even longer.  Thank you for this consultation.     Doris Post, MD     JPL/MEDQ  D:  04/23/2011  T:  04/24/2011  Job:  [id number]  cc:   Rosalyn Gess. Norins, MD Veverly Fells. Excell Seltzer,  MD  Electronically Signed by Teryl Lucy MD on 04/24/2011 04:47:21 PM

## 2011-04-25 ENCOUNTER — Ambulatory Visit (INDEPENDENT_AMBULATORY_CARE_PROVIDER_SITE_OTHER): Payer: Medicare Other | Admitting: *Deleted

## 2011-04-25 ENCOUNTER — Ambulatory Visit (INDEPENDENT_AMBULATORY_CARE_PROVIDER_SITE_OTHER): Payer: Medicare Other | Admitting: Cardiovascular Disease

## 2011-04-25 ENCOUNTER — Telehealth: Payer: Self-pay | Admitting: Cardiovascular Disease

## 2011-04-25 ENCOUNTER — Encounter: Payer: Self-pay | Admitting: Cardiovascular Disease

## 2011-04-25 DIAGNOSIS — I059 Rheumatic mitral valve disease, unspecified: Secondary | ICD-10-CM

## 2011-04-25 DIAGNOSIS — R413 Other amnesia: Secondary | ICD-10-CM

## 2011-04-25 DIAGNOSIS — I4891 Unspecified atrial fibrillation: Secondary | ICD-10-CM

## 2011-04-25 DIAGNOSIS — I6350 Cerebral infarction due to unspecified occlusion or stenosis of unspecified cerebral artery: Secondary | ICD-10-CM

## 2011-04-25 DIAGNOSIS — G459 Transient cerebral ischemic attack, unspecified: Secondary | ICD-10-CM

## 2011-04-25 LAB — POCT INR: INR: 3

## 2011-04-25 NOTE — Telephone Encounter (Signed)
Pt saw Dr. Excell Seltzer today at 12:30pm.

## 2011-04-25 NOTE — Telephone Encounter (Signed)
Per pt daughter calling went to the er on sat night was told to come into the office today. spontaneous bleeding in her legs.

## 2011-04-25 NOTE — Progress Notes (Signed)
HPI:  This is an 77 year old woman presenting for followup evaluation. The patient has a history of mitral valve disease and remote Bjrk-Shiley mitral valve replacement in 1981. She has been managed with long-term anticoagulation. The patient recently had a spontaneous hemorrhage into her left calf associated with a supratherapeutic INR. She continues to have fairly significant left calf pain. A duplex ultrasound was performed on May 30 and demonstrated no evidence of left lower extremity deep or superficial venous thrombus. She also reports a recent fall and injury to her left wrist associated with a large bruise in that area. She denies syncope. She was using a walker and tripped and fell to the ground.  The patient denies head injury. She specifically denies headache, dyspnea, chest pain, lightheadedness, or syncope. She reports compliance with her diet and her medications.  Outpatient Encounter Prescriptions as of 04/25/2011  Medication Sig Dispense Refill  . ALPRAZolam (XANAX) 0.25 MG tablet Take 1 tablet (0.25 mg total) by mouth at bedtime as needed.  30 tablet  5  . aspirin 81 MG tablet Take 81 mg by mouth daily.        . calcium carbonate (TUMS - DOSED IN MG ELEMENTAL CALCIUM) 500 MG chewable tablet Chew 1 tablet by mouth as needed.        . furosemide (LASIX) 40 MG tablet Take one by mouth daily, you may also increase to two tablets as needed for increased swelling (call MD if you increase your Furosemide dose)  45 tablet  11  . Multiple Vitamin (MULTIVITAMIN) tablet Take 1 tablet by mouth daily.        Marland Kitchen omeprazole (PRILOSEC) 20 MG capsule Take 1 capsule (20 mg total) by mouth daily.  30 capsule  3  . Psyllium (METAMUCIL) 30.9 % POWD Take by mouth as needed. Use once daily      . tetrahydrozoline 0.05 % ophthalmic solution 1 drop as needed.        . verapamil (CALAN-SR) 180 MG CR tablet Take 180 mg by mouth. Take 1 1/2 tablet daily       . warfarin (COUMADIN) 4 MG tablet Take as directed by  Anticoagulation clinic   30 tablet  3    Allergies  Allergen Reactions  . Levofloxacin   . Neomycin   . Oxycodone-Acetaminophen   . Statins     Past Medical History  Diagnosis Date  . Bacterial pneumonia     hx of  . Herpes zoster     hx of  . Gout   . CVA (cerebral vascular accident)   . Gastritis, acute     without mention of hemorrhage   . Trigeminal neuralgia   . Stricture and stenosis of esophagus   . Esophageal reflux   . Mitral valve disorders     hx of  . Unspecified peritonitis     hx of  . Acute rheumatic heart disease, unspecified     hx of  . Unspecified transient cerebral ischemia   . Atrial fibrillation   . Hx of mitral valve replacement   . Chronic diastolic heart failure   . Hyperplastic polyps of stomach   . Diverticulosis     ROS: Negative except as per HPI  There were no vitals taken for this visit.  PHYSICAL EXAM: Pt is alert and oriented, thin, elderly woman in NAD HEENT: normal Neck: JVP - normal, carotids 2+= without bruits Lungs: CTA bilaterally CV: RRR with crisp mechanical heart sounds Abd: soft, NT, Positive BS,  no hepatomegaly Ext: there is 2+ edema of the left calf with associated tenderness. There is no redness or warmth. There is diffuse ecchymoses over the dorsum of the left hand without tenderness.  ASSESSMENT AND PLAN:

## 2011-04-25 NOTE — Patient Instructions (Addendum)
Stop Aspirin.  Use hydrocodone 5/325mg  1 tablet  every 6 hours as needed for pain. You have the prescription for 30 tablets.  The new INR goal is 3.0-3.5.  Schedule an appointment to see Dr Excell Seltzer in 3 months.

## 2011-04-25 NOTE — Assessment & Plan Note (Deleted)
The patient did not remember coming in for her venous duplex last week. She thought that all this workup was done in the hospital and she was a little frustrated about having to go to the emergency room over the weekend. I reviewed with her that we performed a duplex exam here in the office in order to evaluate her left leg swelling.

## 2011-04-25 NOTE — Assessment & Plan Note (Signed)
Discussion as above regarding risk of thromboembolism. The patient's physical exam is stable. She has had a recent echocardiogram and has no signs of congestive heart failure at present.

## 2011-04-25 NOTE — Assessment & Plan Note (Signed)
The patient has permanent atrial fibrillation in the setting of mechanical mitral prosthesis. This carries a very high risk of thromboembolism. However, the patient has now had a spontaneous hemorrhage associated with a supratherapeutic INR and has had another recent fall with left wrist ecchymoses and injury. I have recommended decreasing her goal INR range to 3.0-3.5 and also discontinuing antiplatelet therapy with aspirin. She understands these instructions. Her TIA was several years ago and she has had no recurrent events. We will have to continue to balance the risk of hemorrhage versus the risk of thromboembolism and this may become a very precarious situation. I discussed this at length with the patient and her son.

## 2011-04-26 ENCOUNTER — Encounter: Payer: Medicare Other | Admitting: *Deleted

## 2011-04-27 ENCOUNTER — Encounter: Payer: Medicare Other | Admitting: *Deleted

## 2011-05-02 ENCOUNTER — Ambulatory Visit (INDEPENDENT_AMBULATORY_CARE_PROVIDER_SITE_OTHER): Payer: Medicare Other | Admitting: *Deleted

## 2011-05-02 DIAGNOSIS — G459 Transient cerebral ischemic attack, unspecified: Secondary | ICD-10-CM

## 2011-05-02 DIAGNOSIS — I6350 Cerebral infarction due to unspecified occlusion or stenosis of unspecified cerebral artery: Secondary | ICD-10-CM

## 2011-05-02 DIAGNOSIS — I4891 Unspecified atrial fibrillation: Secondary | ICD-10-CM

## 2011-05-02 DIAGNOSIS — I059 Rheumatic mitral valve disease, unspecified: Secondary | ICD-10-CM

## 2011-05-02 LAB — POCT INR: INR: 2.4

## 2011-05-16 ENCOUNTER — Ambulatory Visit (INDEPENDENT_AMBULATORY_CARE_PROVIDER_SITE_OTHER): Payer: Medicare Other | Admitting: *Deleted

## 2011-05-16 DIAGNOSIS — I4891 Unspecified atrial fibrillation: Secondary | ICD-10-CM

## 2011-05-16 DIAGNOSIS — I059 Rheumatic mitral valve disease, unspecified: Secondary | ICD-10-CM

## 2011-05-16 DIAGNOSIS — G459 Transient cerebral ischemic attack, unspecified: Secondary | ICD-10-CM

## 2011-05-16 DIAGNOSIS — I6350 Cerebral infarction due to unspecified occlusion or stenosis of unspecified cerebral artery: Secondary | ICD-10-CM

## 2011-05-16 LAB — POCT INR: INR: 4.1

## 2011-05-30 ENCOUNTER — Ambulatory Visit (INDEPENDENT_AMBULATORY_CARE_PROVIDER_SITE_OTHER): Payer: Medicare Other | Admitting: *Deleted

## 2011-05-30 DIAGNOSIS — I059 Rheumatic mitral valve disease, unspecified: Secondary | ICD-10-CM

## 2011-05-30 DIAGNOSIS — I6350 Cerebral infarction due to unspecified occlusion or stenosis of unspecified cerebral artery: Secondary | ICD-10-CM

## 2011-05-30 DIAGNOSIS — G459 Transient cerebral ischemic attack, unspecified: Secondary | ICD-10-CM

## 2011-05-30 DIAGNOSIS — I4891 Unspecified atrial fibrillation: Secondary | ICD-10-CM

## 2011-05-30 LAB — POCT INR: INR: 4.8

## 2011-06-09 ENCOUNTER — Ambulatory Visit (INDEPENDENT_AMBULATORY_CARE_PROVIDER_SITE_OTHER): Payer: Medicare Other | Admitting: *Deleted

## 2011-06-09 DIAGNOSIS — I059 Rheumatic mitral valve disease, unspecified: Secondary | ICD-10-CM

## 2011-06-09 DIAGNOSIS — I6350 Cerebral infarction due to unspecified occlusion or stenosis of unspecified cerebral artery: Secondary | ICD-10-CM

## 2011-06-09 DIAGNOSIS — G459 Transient cerebral ischemic attack, unspecified: Secondary | ICD-10-CM

## 2011-06-09 DIAGNOSIS — I4891 Unspecified atrial fibrillation: Secondary | ICD-10-CM

## 2011-06-09 LAB — POCT INR: INR: 3.4

## 2011-06-29 ENCOUNTER — Other Ambulatory Visit: Payer: Self-pay | Admitting: Pharmacist

## 2011-06-29 ENCOUNTER — Ambulatory Visit (INDEPENDENT_AMBULATORY_CARE_PROVIDER_SITE_OTHER): Payer: Medicare Other | Admitting: *Deleted

## 2011-06-29 DIAGNOSIS — G459 Transient cerebral ischemic attack, unspecified: Secondary | ICD-10-CM

## 2011-06-29 DIAGNOSIS — I4891 Unspecified atrial fibrillation: Secondary | ICD-10-CM

## 2011-06-29 DIAGNOSIS — I059 Rheumatic mitral valve disease, unspecified: Secondary | ICD-10-CM

## 2011-06-29 DIAGNOSIS — Z954 Presence of other heart-valve replacement: Secondary | ICD-10-CM

## 2011-06-29 DIAGNOSIS — I6350 Cerebral infarction due to unspecified occlusion or stenosis of unspecified cerebral artery: Secondary | ICD-10-CM

## 2011-06-29 LAB — POCT INR: INR: 3.1

## 2011-06-29 MED ORDER — WARFARIN SODIUM 4 MG PO TABS: ORAL_TABLET | ORAL | Status: AC

## 2011-06-30 ENCOUNTER — Other Ambulatory Visit: Payer: Self-pay | Admitting: Pharmacist

## 2011-07-20 ENCOUNTER — Ambulatory Visit (INDEPENDENT_AMBULATORY_CARE_PROVIDER_SITE_OTHER): Payer: Medicare Other | Admitting: *Deleted

## 2011-07-20 DIAGNOSIS — G459 Transient cerebral ischemic attack, unspecified: Secondary | ICD-10-CM

## 2011-07-20 DIAGNOSIS — I059 Rheumatic mitral valve disease, unspecified: Secondary | ICD-10-CM

## 2011-07-20 DIAGNOSIS — I4891 Unspecified atrial fibrillation: Secondary | ICD-10-CM

## 2011-07-20 DIAGNOSIS — Z7901 Long term (current) use of anticoagulants: Secondary | ICD-10-CM | POA: Insufficient documentation

## 2011-07-20 DIAGNOSIS — I6350 Cerebral infarction due to unspecified occlusion or stenosis of unspecified cerebral artery: Secondary | ICD-10-CM

## 2011-07-20 LAB — POCT INR: INR: 2.5

## 2011-07-22 ENCOUNTER — Telehealth: Payer: Self-pay | Admitting: Cardiovascular Disease

## 2011-07-22 NOTE — Telephone Encounter (Signed)
Doris [name] called to inform us that [facility] was having IT problems yesterday and did not receive the fax containing her medical records. I faxed a second copy of her records to [facility] ([phone]).

## 2011-07-28 ENCOUNTER — Other Ambulatory Visit: Payer: Self-pay | Admitting: *Deleted

## 2011-07-28 MED ORDER — VERAPAMIL HCL ER 180 MG PO TBCR: EXTENDED_RELEASE_TABLET | ORAL | Status: AC

## 2011-08-04 ENCOUNTER — Encounter: Payer: Self-pay | Admitting: Cardiovascular Disease

## 2011-08-04 ENCOUNTER — Ambulatory Visit (INDEPENDENT_AMBULATORY_CARE_PROVIDER_SITE_OTHER): Payer: Medicare Other | Admitting: *Deleted

## 2011-08-04 ENCOUNTER — Ambulatory Visit (INDEPENDENT_AMBULATORY_CARE_PROVIDER_SITE_OTHER): Payer: Medicare Other | Admitting: Cardiovascular Disease

## 2011-08-04 DIAGNOSIS — I6350 Cerebral infarction due to unspecified occlusion or stenosis of unspecified cerebral artery: Secondary | ICD-10-CM

## 2011-08-04 DIAGNOSIS — I509 Heart failure, unspecified: Secondary | ICD-10-CM

## 2011-08-04 DIAGNOSIS — G459 Transient cerebral ischemic attack, unspecified: Secondary | ICD-10-CM

## 2011-08-04 DIAGNOSIS — I059 Rheumatic mitral valve disease, unspecified: Secondary | ICD-10-CM

## 2011-08-04 DIAGNOSIS — I4891 Unspecified atrial fibrillation: Secondary | ICD-10-CM

## 2011-08-04 DIAGNOSIS — I5032 Chronic diastolic (congestive) heart failure: Secondary | ICD-10-CM

## 2011-08-04 NOTE — Assessment & Plan Note (Signed)
The patient has permanent atrial fibrillation and will be maintained on chronic anticoagulation. Her heart rate remains well controlled.

## 2011-08-04 NOTE — Assessment & Plan Note (Signed)
The patient is stable with respect to her mitral valve disease and mechanical mitral prosthesis.  She is currently doing well with a goal INR of 3.0-3.5. She is off of antiplatelet therapy because of a spontaneous bleed in her leg. Her last echocardiogram from 2011 was reviewed and this demonstrated a normal functioning mitral prosthesis with severe biatrial enlargement and preserved left ventricular systolic function. Mild pulmonary hypertension was noted. She has class II symptoms and will continue her current medical program.

## 2011-08-04 NOTE — Progress Notes (Signed)
HPI:  This is an 77 year old woman presenting for followup evaluation. The patient has a history of rheumatic mitral valve disease. She underwent mitral valve replacement with a Bjork-Shiley valve in 1981.  She has been followed by our practice for over 30 years and is going to be relocating to [facility] [place] and the next few months. The patient had a remote TIA and has been maintained on chronic anticoagulation with a goal INR of 3.5 - 4.0. She's also been on long-term low-dose antiplatelet therapy with aspirin 81 mg. She recently has become more unsteady on her feet and she's also had a spontaneous bleed in the left leg. We have discontinued her aspirin and her goal INR has been lowered to 3.0-3.5.  Overall she is doing well at present. She's had no more bleeding events. She remains active. Over the past year she's had difficulty with short-term memory, but she remains highly functional. She is moving around a to be closer to her son.  The patient denies chest pain or pressure. She denies orthopnea, PND, exertional dyspnea, or leg swelling. She's had no recent episodes of lightheadedness or near syncope. Over the past 2 years she has had difficulty with episodic shortness of breath but she's had a good response to low-dose furosemide. Echocardiography has shown preserved left ventricular function, a normally functioning mitral prosthesis, and abnormal diastolic filling pattern.  Outpatient Encounter Prescriptions as of 08/04/2011  Medication Sig Dispense Refill  . ALPRAZolam (XANAX) 0.25 MG tablet Take 1 tablet (0.25 mg total) by mouth at bedtime as needed.  30 tablet  5  . calcium carbonate (TUMS - DOSED IN MG ELEMENTAL CALCIUM) 500 MG chewable tablet Chew 1 tablet by mouth as needed.        . furosemide (LASIX) 40 MG tablet Take one by mouth daily, you may also increase to two tablets as needed for increased swelling (call MD if you increase your Furosemide dose)  45 tablet  11  . Multiple Vitamin  (MULTIVITAMIN) tablet Take 1 tablet by mouth daily.        . Psyllium (METAMUCIL) 30.9 % POWD Take by mouth as needed. Use once daily      . tetrahydrozoline 0.05 % ophthalmic solution 1 drop as needed.        . verapamil (CALAN-SR) 180 MG CR tablet 1.5 tablets daily  45 tablet  6  . warfarin (COUMADIN) 4 MG tablet Take as directed by Anticoagulation clinic   30 tablet  3  . DISCONTD: omeprazole (PRILOSEC) 20 MG capsule Take 1 capsule (20 mg total) by mouth daily.  30 capsule  3    Allergies  Allergen Reactions  . Levofloxacin   . Neomycin   . Oxycodone-Acetaminophen   . Statins     Past Medical History  Diagnosis Date  . Bacterial pneumonia     hx of  . Herpes zoster     hx of  . Gout   . CVA (cerebral vascular accident)   . Gastritis, acute     without mention of hemorrhage   . Trigeminal neuralgia   . Stricture and stenosis of esophagus   . Esophageal reflux   . Mitral valve disorders     hx of  . Unspecified peritonitis     hx of  . Acute rheumatic heart disease, unspecified     hx of  . Unspecified transient cerebral ischemia   . Atrial fibrillation   . Hx of mitral valve replacement   . Chronic  diastolic heart failure   . Hyperplastic polyps of stomach   . Diverticulosis     ROS: Negative except as per HPI  BP 126/60  Pulse 68  Ht 5' 3.5" (1.613 m)  Wt 106 lb (48.081 kg)  BMI 18.48 kg/m2  PHYSICAL EXAM: Pt is alert and oriented, thin, elderly woman in NAD. HEENT: normal Neck: JVP - normal, carotids 2+= without bruits Lungs: CTA bilaterally CV: irregularly irregular with soft systolic murmur at the left sternal border and crisp mechanical mitral closure sounds Abd: soft, NT, Positive BS, no hepatomegaly Ext: no C/C/E, distal pulses intact and equal Skin: warm/dry no rash  EKG: atrial fibrillation 68 beats per minute, LVH with repolarization abnormality.  ASSESSMENT AND PLAN:

## 2011-08-04 NOTE — Assessment & Plan Note (Signed)
There is no evidence of volume overload on exam. She has minimal respiratory symptoms. She appears to be doing well on her current dose of furosemide.

## 2011-08-11 ENCOUNTER — Ambulatory Visit: Payer: Medicare Other | Admitting: Cardiovascular Disease

## 2011-08-19 LAB — POCT I-STAT, CHEM 8
BUN: 18
Calcium, Ion: 1.1 — ABNORMAL LOW
Hemoglobin: 14.6
TCO2: 30

## 2011-08-19 LAB — URINALYSIS, ROUTINE W REFLEX MICROSCOPIC
Glucose, UA: NEGATIVE
Hgb urine dipstick: NEGATIVE
Ketones, ur: NEGATIVE
Protein, ur: NEGATIVE

## 2011-08-19 LAB — DIFFERENTIAL
Basophils Absolute: 0.1
Basophils Relative: 1
Monocytes Absolute: 0.6
Neutro Abs: 3.5
Neutrophils Relative %: 64

## 2011-08-19 LAB — BASIC METABOLIC PANEL
Chloride: 109
GFR calc Af Amer: >60
Potassium: 3.7

## 2011-08-19 LAB — CBC
HCT: 39.6
Hemoglobin: 14.1
MCHC: 34
MCV: 91.3
Platelets: 178
RBC: 4.34
RDW: 13.8
WBC: 3.9 — ABNORMAL LOW

## 2011-08-19 LAB — LIPID PANEL
LDL Cholesterol: 192 — ABNORMAL HIGH
Total CHOL/HDL Ratio: 3.1
Triglycerides: 69
VLDL: 14

## 2011-08-19 LAB — PROTIME-INR: INR: 2.8 — ABNORMAL HIGH

## 2011-08-19 LAB — D-DIMER, QUANTITATIVE: D-Dimer, Quant: 0.37

## 2011-08-19 LAB — POCT CARDIAC MARKERS: Troponin i, poc: <0.05

## 2015-05-22 DEATH — deceased

## 2016-07-26 ENCOUNTER — Telehealth: Payer: Self-pay

## 2016-07-26 NOTE — Telephone Encounter (Signed)
Review - incorrect patient.

## 2016-07-26 NOTE — Telephone Encounter (Signed)
[facility] working fantastic! Wants to have more samples or 30 day Rx sent in.
